# Patient Record
Sex: Male | Born: 1945 | Race: Black or African American | Hispanic: No | Marital: Married | Smoking: Former smoker
Health system: Southern US, Community
[De-identification: ages and names within clinical notes are randomized; demographics above are authoritative.]

## PROBLEM LIST (undated history)

## (undated) DIAGNOSIS — E785 Hyperlipidemia, unspecified: Secondary | ICD-10-CM

## (undated) DIAGNOSIS — K5790 Diverticulosis of intestine, part unspecified, without perforation or abscess without bleeding: Secondary | ICD-10-CM

## (undated) DIAGNOSIS — C61 Malignant neoplasm of prostate: Secondary | ICD-10-CM

## (undated) DIAGNOSIS — I10 Essential (primary) hypertension: Secondary | ICD-10-CM

## (undated) DIAGNOSIS — N529 Male erectile dysfunction, unspecified: Secondary | ICD-10-CM

## (undated) DIAGNOSIS — M1990 Unspecified osteoarthritis, unspecified site: Secondary | ICD-10-CM

## (undated) DIAGNOSIS — C801 Malignant (primary) neoplasm, unspecified: Secondary | ICD-10-CM

## (undated) HISTORY — DX: Male erectile dysfunction, unspecified: N52.9

## (undated) HISTORY — DX: Essential (primary) hypertension: I10

## (undated) HISTORY — PX: PROSTATECTOMY: SHX69

## (undated) HISTORY — DX: Diverticulosis of intestine, part unspecified, without perforation or abscess without bleeding: K57.90

## (undated) HISTORY — DX: Malignant (primary) neoplasm, unspecified: C80.1

## (undated) HISTORY — PX: PROSTATE SURGERY: SHX751

## (undated) HISTORY — PX: TRABECULECTOMY: SHX107

## (undated) HISTORY — DX: Unspecified osteoarthritis, unspecified site: M19.90

## (undated) HISTORY — DX: Hyperlipidemia, unspecified: E78.5

---

## 2004-05-30 ENCOUNTER — Encounter: Admission: RE | Admit: 2004-05-30 | Discharge: 2004-05-30 | Payer: Self-pay | Admitting: Family Medicine

## 2005-03-12 ENCOUNTER — Encounter: Admission: RE | Admit: 2005-03-12 | Discharge: 2005-03-12 | Payer: Self-pay | Admitting: Urology

## 2005-04-16 ENCOUNTER — Encounter (INDEPENDENT_AMBULATORY_CARE_PROVIDER_SITE_OTHER): Payer: Self-pay | Admitting: Specialist

## 2005-04-16 ENCOUNTER — Inpatient Hospital Stay (HOSPITAL_COMMUNITY): Admission: RE | Admit: 2005-04-16 | Discharge: 2005-04-20 | Payer: Self-pay | Admitting: Urology

## 2005-05-04 ENCOUNTER — Inpatient Hospital Stay (HOSPITAL_COMMUNITY): Admission: AD | Admit: 2005-05-04 | Discharge: 2005-05-09 | Payer: Self-pay | Admitting: Urology

## 2005-05-05 ENCOUNTER — Encounter (INDEPENDENT_AMBULATORY_CARE_PROVIDER_SITE_OTHER): Payer: Self-pay | Admitting: Specialist

## 2005-06-15 ENCOUNTER — Encounter: Admission: RE | Admit: 2005-06-15 | Discharge: 2005-06-15 | Payer: Self-pay | Admitting: Family Medicine

## 2006-01-19 ENCOUNTER — Ambulatory Visit: Payer: Self-pay | Admitting: Family Medicine

## 2006-02-22 ENCOUNTER — Ambulatory Visit: Payer: Self-pay | Admitting: Family Medicine

## 2006-03-25 ENCOUNTER — Ambulatory Visit: Payer: Self-pay | Admitting: Family Medicine

## 2006-05-18 HISTORY — PX: COLONOSCOPY: SHX174

## 2006-08-04 ENCOUNTER — Ambulatory Visit: Payer: Self-pay | Admitting: Family Medicine

## 2006-10-04 ENCOUNTER — Ambulatory Visit: Payer: Self-pay | Admitting: Family Medicine

## 2006-10-14 LAB — HM COLONOSCOPY

## 2007-01-24 ENCOUNTER — Ambulatory Visit: Payer: Self-pay | Admitting: Family Medicine

## 2007-01-25 ENCOUNTER — Ambulatory Visit: Admission: RE | Admit: 2007-01-25 | Discharge: 2007-04-17 | Payer: Self-pay | Admitting: Radiation Oncology

## 2007-04-25 ENCOUNTER — Ambulatory Visit: Payer: Self-pay | Admitting: Family Medicine

## 2007-09-08 ENCOUNTER — Ambulatory Visit: Payer: Self-pay | Admitting: Family Medicine

## 2007-09-08 ENCOUNTER — Encounter: Admission: RE | Admit: 2007-09-08 | Discharge: 2007-09-08 | Payer: Self-pay | Admitting: Family Medicine

## 2008-01-25 ENCOUNTER — Ambulatory Visit: Payer: Self-pay | Admitting: Family Medicine

## 2008-05-22 ENCOUNTER — Ambulatory Visit: Payer: Self-pay | Admitting: Family Medicine

## 2009-01-19 ENCOUNTER — Inpatient Hospital Stay (HOSPITAL_COMMUNITY): Admission: EM | Admit: 2009-01-19 | Discharge: 2009-01-20 | Payer: Self-pay | Admitting: Emergency Medicine

## 2009-01-19 ENCOUNTER — Ambulatory Visit: Payer: Self-pay | Admitting: Gastroenterology

## 2009-01-28 ENCOUNTER — Ambulatory Visit: Payer: Self-pay | Admitting: Family Medicine

## 2009-02-18 ENCOUNTER — Ambulatory Visit: Payer: Self-pay | Admitting: Family Medicine

## 2009-05-23 ENCOUNTER — Encounter: Admission: RE | Admit: 2009-05-23 | Discharge: 2009-05-23 | Payer: Self-pay | Admitting: Family Medicine

## 2009-05-23 ENCOUNTER — Ambulatory Visit: Payer: Self-pay | Admitting: Family Medicine

## 2010-01-08 ENCOUNTER — Ambulatory Visit (HOSPITAL_COMMUNITY): Admission: RE | Admit: 2010-01-08 | Discharge: 2010-01-08 | Payer: Self-pay | Admitting: Urology

## 2010-02-03 ENCOUNTER — Ambulatory Visit: Payer: Self-pay | Admitting: Family Medicine

## 2010-08-22 LAB — ABO/RH: ABO/RH(D): B POS

## 2010-08-22 LAB — RENAL FUNCTION PANEL
Albumin: 3 g/dL — ABNORMAL LOW (ref 3.5–5.2)
Albumin: 3.2 g/dL — ABNORMAL LOW (ref 3.5–5.2)
Albumin: 3.2 g/dL — ABNORMAL LOW (ref 3.5–5.2)
BUN: 11 mg/dL (ref 6–23)
BUN: 15 mg/dL (ref 6–23)
BUN: 8 mg/dL (ref 6–23)
CO2: 28 mEq/L (ref 19–32)
CO2: 28 mEq/L (ref 19–32)
CO2: 30 mEq/L (ref 19–32)
Calcium: 8.5 mg/dL (ref 8.4–10.5)
Calcium: 8.6 mg/dL (ref 8.4–10.5)
Calcium: 8.7 mg/dL (ref 8.4–10.5)
Chloride: 105 mEq/L (ref 96–112)
Chloride: 107 mEq/L (ref 96–112)
Chloride: 108 mEq/L (ref 96–112)
Creatinine, Ser: 1.15 mg/dL (ref 0.4–1.5)
Creatinine, Ser: 1.19 mg/dL (ref 0.4–1.5)
Creatinine, Ser: 1.3 mg/dL (ref 0.4–1.5)
GFR calc Af Amer: >60 mL/min (ref >60)
GFR calc Af Amer: >60 mL/min (ref >60)
GFR calc Af Amer: >60 mL/min (ref >60)
GFR calc non Af Amer: 56 mL/min — ABNORMAL LOW (ref >60)
GFR calc non Af Amer: >60 mL/min (ref >60)
GFR calc non Af Amer: >60 mL/min (ref >60)
Glucose, Bld: 113 mg/dL — ABNORMAL HIGH (ref 70–99)
Glucose, Bld: 122 mg/dL — ABNORMAL HIGH (ref 70–99)
Glucose, Bld: 141 mg/dL — ABNORMAL HIGH (ref 70–99)
Phosphorus: 2.6 mg/dL (ref 2.3–4.6)
Phosphorus: 3.1 mg/dL (ref 2.3–4.6)
Phosphorus: 3.6 mg/dL (ref 2.3–4.6)
Potassium: 4.4 mEq/L (ref 3.5–5.1)
Potassium: 5 mEq/L (ref 3.5–5.1)
Potassium: 5.1 mEq/L (ref 3.5–5.1)
Sodium: 139 mEq/L (ref 135–145)
Sodium: 140 mEq/L (ref 135–145)
Sodium: 140 mEq/L (ref 135–145)

## 2010-08-22 LAB — DIFFERENTIAL
Basophils Absolute: 0 10*3/uL (ref 0.0–0.1)
Basophils Relative: 1 % (ref 0–1)
Eosinophils Absolute: 0.2 10*3/uL (ref 0.0–0.7)
Eosinophils Relative: 3 % (ref 0–5)
Lymphocytes Relative: 26 % (ref 12–46)
Lymphs Abs: 1.9 10*3/uL (ref 0.7–4.0)
Monocytes Absolute: 0.7 10*3/uL (ref 0.1–1.0)
Monocytes Relative: 10 % (ref 3–12)
Neutro Abs: 4.3 10*3/uL (ref 1.7–7.7)
Neutrophils Relative %: 60 % (ref 43–77)

## 2010-08-22 LAB — CBC
HCT: 33.2 % — ABNORMAL LOW (ref 39.0–52.0)
HCT: 34.9 % — ABNORMAL LOW (ref 39.0–52.0)
HCT: 36.1 % — ABNORMAL LOW (ref 39.0–52.0)
HCT: 39.8 % (ref 39.0–52.0)
Hemoglobin: 11 g/dL — ABNORMAL LOW (ref 13.0–17.0)
Hemoglobin: 11.6 g/dL — ABNORMAL LOW (ref 13.0–17.0)
Hemoglobin: 11.9 g/dL — ABNORMAL LOW (ref 13.0–17.0)
Hemoglobin: 13.2 g/dL (ref 13.0–17.0)
MCHC: 33 g/dL (ref 30.0–36.0)
MCHC: 33.2 g/dL (ref 30.0–36.0)
MCHC: 33.2 g/dL (ref 30.0–36.0)
MCHC: 33.2 g/dL (ref 30.0–36.0)
MCV: 86.1 fL (ref 78.0–100.0)
MCV: 86.5 fL (ref 78.0–100.0)
MCV: 86.6 fL (ref 78.0–100.0)
MCV: 86.9 fL (ref 78.0–100.0)
Platelets: 149 10*3/uL — ABNORMAL LOW (ref 150–400)
Platelets: 167 10*3/uL (ref 150–400)
Platelets: 171 10*3/uL (ref 150–400)
Platelets: 176 10*3/uL (ref 150–400)
RBC: 3.84 MIL/uL — ABNORMAL LOW (ref 4.22–5.81)
RBC: 4.03 MIL/uL — ABNORMAL LOW (ref 4.22–5.81)
RBC: 4.16 MIL/uL — ABNORMAL LOW (ref 4.22–5.81)
RBC: 4.62 MIL/uL (ref 4.22–5.81)
RDW: 15.4 % (ref 11.5–15.5)
RDW: 15.7 % — ABNORMAL HIGH (ref 11.5–15.5)
RDW: 15.9 % — ABNORMAL HIGH (ref 11.5–15.5)
RDW: 15.9 % — ABNORMAL HIGH (ref 11.5–15.5)
WBC: 5.1 10*3/uL (ref 4.0–10.5)
WBC: 6.4 10*3/uL (ref 4.0–10.5)
WBC: 6.5 10*3/uL (ref 4.0–10.5)
WBC: 7.1 10*3/uL (ref 4.0–10.5)

## 2010-08-22 LAB — TYPE AND SCREEN
ABO/RH(D): B POS
Antibody Screen: NEGATIVE

## 2010-08-22 LAB — COMPREHENSIVE METABOLIC PANEL
ALT: 25 U/L (ref 0–53)
AST: 30 U/L (ref 0–37)
Albumin: 3.6 g/dL (ref 3.5–5.2)
Alkaline Phosphatase: 84 U/L (ref 39–117)
BUN: 19 mg/dL (ref 6–23)
CO2: 24 mEq/L (ref 19–32)
Calcium: 8.9 mg/dL (ref 8.4–10.5)
Chloride: 108 mEq/L (ref 96–112)
Creatinine, Ser: 1.38 mg/dL (ref 0.4–1.5)
GFR calc Af Amer: >60 mL/min (ref >60)
GFR calc non Af Amer: 52 mL/min — ABNORMAL LOW (ref >60)
Glucose, Bld: 154 mg/dL — ABNORMAL HIGH (ref 70–99)
Potassium: 4.5 mEq/L (ref 3.5–5.1)
Sodium: 140 mEq/L (ref 135–145)
Total Bilirubin: 0.8 mg/dL (ref 0.3–1.2)
Total Protein: 6.9 g/dL (ref 6.0–8.3)

## 2010-08-22 LAB — HEMOGLOBIN AND HEMATOCRIT, BLOOD
HCT: 34.9 % — ABNORMAL LOW (ref 39.0–52.0)
Hemoglobin: 11.6 g/dL — ABNORMAL LOW (ref 13.0–17.0)

## 2010-08-22 LAB — PROTIME-INR
INR: 1 (ref 0.00–1.49)
Prothrombin Time: 13.3 seconds (ref 11.6–15.2)

## 2010-08-22 LAB — APTT: aPTT: 26 seconds (ref 24–37)

## 2010-10-03 NOTE — Consult Note (Signed)
[name], [name]           ACCOUNT NO.:  [id number]   MEDICAL RECORD NO.:  [id number]          PATIENT TYPE:  INP   LOCATION:  1505                         FACILITY:  [facility]   PHYSICIAN:  Hollice Espy, M.D.DATE OF BIRTH:  Feb 15, 1946   DATE OF CONSULTATION:  05/07/2005  DATE OF DISCHARGE:                                   CONSULTATION   REASON FOR CONSULTATION:  Management of a DVT.   HISTORY OF PRESENT ILLNESS:  The patient is a 65 year old African-American  male with past medical history of prostate cancer and hypertension who was  admitted on May 04, 2005, after undergoing a radical prostatectomy on  April 16, 2005.  He was found to have a lower extremity occlusive DVT as  well as a pelvic lymphocele.  The patient was admitted, started on heparin,  and remained stable.  He showed no evidence of any pulmonary embolus.  The  patient underwent a left pelvic fluid collection drainage on May 05, 2005, when his lymphocele was drained. Following this on December 20, he was  restarted back on heparin, and Coumadin protocol was done for long-term  anticoagulation.  The patient remained stable.  [facility] were  consulted on December 21 to help with this patient in transitioning from  heparin to Lovenox and Coumadin to get patient home.   The patient himself is doing well.  He has no complaints.  He denies any  headaches, vision changes, dysphagia, chest pain, palpitations, shortness of  breath, wheeze, cough, abdominal pain.  His catheter has been removed, and  he is having some difficulty voiding, and he says that overall he feels  better.  He says the swelling on the left side of his groin has decreased  significantly.   REVIEW OF SYSTEMS:  Otherwise negative.   PAST MEDICAL HISTORY:  1.  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  2.  He is also status post a recent  drainage of a pelvic lymphocele.   MEDICATIONS:  The patient currently here in the hospital is on:  1.  IV heparin.  2.  Coumadin which has been initiated.  3.  Hydrochlorothiazide 12.5 p.o. daily.  4.  Colace 100 mg p.o. twice daily.  5.  Restoril p.r.n.  6.  Vicodin p.r.n.  7.  Percocet p.r.n.  8.  Dulcolax p.r.n.   ALLERGIES:  No known drug allergies.   SOCIAL HISTORY:  He denies any heavy tobacco, alcohol, or drug use.   FAMILY HISTORY:  Noncontributory.   PHYSICAL EXAMINATION:  VITAL SIGNS:  Heart rate 83, blood pressure 132/81,  respirations 20, temperature 96.5, O2 saturation 96% on room air.  GENERAL:  The patient is alert and oriented x3, no apparent distress.  HEENT:  Normocephalic and atraumatic.  Mucous membranes ae moist.  NECK:  He has no carotid bruits.  HEART: Regular rate and rhythm, S1, S2.  LUNGS:  Clear to auscultation bilaterally.  ABDOMEN: Soft, nontender, nondistended.  Positive bowel sounds.  EXTREMITIES:  His left groin is slightly indurated and slightly swollen,  although  he assures me that this much less so than before.  He has trace  pitting edema.  He is currently wearing some mild compressive hose.   LABORATORY WORK:  PT today is 15.5, INR 1.2.  White count 5.5, hemoglobin  10.7, hematocrit 32.7, platelet count 398.   ASSESSMENT AND PLAN:  1.  Deep vein thrombosis: The patient is currently on IV heparin, receiving      Coumadin therapy.  Will change heparin over the subcutaneous Lovenox 120      q.12h.  Pharmacy calculated this dose based on his weight at 1 mg/kg.      Pharmacy to confirm this dose prior to initiating.  I have also asked      case manager and social for assistance to see if we can set up patient      for home Lovenox therapy.  If this is so, the patient would be greatly      interested in this.  I discussed this with him.  In the meantime, we      will start the patient on the instruction book for Coumadin as well as       watching the cassette instructional video.  I have advised the patient      that being on Coumadin, his blood will be extremely thin and, while he      may resume a normal lifestyle, I would not recommend him going to work,      especially when we discussed that he is in [employer] and is often      involved in frequent very minor accidents including metal shavings,      cuts, and hand injuries.  I discussed this extensively with the patient      in the room as well as his wife, and we have agreed the best thing for      him would likely be to stay on disability or to find a possible side job      that does not involve active labor where he is at risk for injury.  If      patient is able to be approved for home Lovenox therapy, we will set up      home health and send him home on Lovenox and Coumadin with daily PT/INR      checks.  He has a primary care physician who can follow his INRs once he      has been discharged from the hospital.  If the patient is unable to get      Lovenox at home, then would continue with Coumadin plus Lovenox therapy      here in the hospital.  2.  Complaints of constipation which is patient's sole complaint.  He has      already been on Colace plus Dulcolax which do not seem to be helping.      We will try Senokot.  If this is not working, we can try something      stronger such as Fleet's enema.  The constipation is secondary to      patient's receiving narcotics.      Hollice Espy, M.D.  Electronically Signed     SKK/MEDQ  D:  05/08/2005  T:  05/09/2005  Job:  [id number]   cc:   [name], M.D.  Fax: [phone]

## 2010-10-03 NOTE — Discharge Summary (Signed)
[name], [name]           ACCOUNT NO.:  [id number]   MEDICAL RECORD NO.:  [id number]          PATIENT TYPE:  INP   LOCATION:  1505                         FACILITY:  [facility]   PHYSICIAN:  [name], M.D.    DATE OF BIRTH:  10/03/1945   DATE OF ADMISSION:  05/04/2005  DATE OF DISCHARGE:  05/09/2005                                 DISCHARGE SUMMARY   DISCHARGE DIAGNOSES:  1.  Prostate cancer.  2.  Left lower extremity deep venous thrombosis.  3.  Left pelvic lymphocele.   PROCEDURES:  1.  Percutaneous drainage of left pelvic lymphocele.  2.  Lower extremity duplex ultrasound.   SURGEON:  Dr. Excell Seltzer. Wrenn   CONSULTATIONS:  1.  Dr. Virginia Rochester.  2.  Dr. Susann Givens, primary care physician.   HISTORY OF PRESENT ILLNESS:  This is a very pleasant 65 year old gentleman  who had previously undergone radical retropubic prostatectomy on April 16, 2005.  This was accompanied with bilateral pelvic lymph node dissection.  He returned to clinic on May 04, 2005 for removal of his Foley catheter  as well as for wound check.  He was noted to have very edematous and tight  left lower extremity.  He was imaged with lower extremity venous duplex  ultrasound which revealed a lower extremity deep venous thrombosis as well  as a lymphocele.  The patient was admitted to the hospital for treatment of  the above complaints.   HOSPITAL COURSE:  On May 04, 2005, the patient was admitted to the  urology floor and was placed on heparin drip. On the first hospital day, he  was taken to interventional radiology who percutaneously drained his left  pelvic lymphocele by 75 mL of fluid.  This was sent for culture which was  ultimately negative.  It was also sent for creatinine and the fluid was  noted to be serous with a level of 1.3. He was then transferred back to the  floor in stable condition where he remained throughout his hospital stay.  Immediately after the aspiration and  lymphocele, he had immediate decrease  in edema and pain in his left lower extremity.  He was managed throughout  his hospitalization with heparin drip.  Once he was therapeutic on this  drip, he was bridged to Coumadin per the pharmacy team with a goal of nine  hour two to three.  Patient's INR did not bump so he was switched to Lovenox  120 mg subcu q.12h.  At this time, his INR was 1.2 and after consulting with  home care management and pharmacy it was decided to send the patient home  with a daily dose of 7.5 mg of Coumadin as well as with b.i.d. Lovenox x  seven days.   PHYSICAL EXAMINATION:  VITAL SIGNS: Vital signs per spreadsheet.  ABDOMEN: Soft, nondistended, nontender, palpation without costovertebral  angle tenderness.  Incision was clean, dry and intact, percutaneous drainage  site was clean, dry and intact.  EXTREMITIES: There is mild edema of his left lower extremity to the level of  the hip.  However, it is much decreased  since admission.  For remainder of  the physical exam, please consult history and physical as there is no  change.   DISCHARGE INSTRUCTIONS:  The patient was given instructions on wound care.  His wound care instructions from his previous hospitalization were  reinforced.  He was set up for home health care for administration of his  Lovenox 120 mg b.i.d. x 14 doses.   FOLLOW-UP PLAN:  He will follow up with Dr. Rito Ehrlich with his INR on  May 12, 2005.  He will then subsequently follow up with his primary  care physician, Dr. Sharlot Gowda.  He will also follow up with Dr. Annabell Howells for  wound check and check of his PSA as previously scheduled.     ______________________________  Glade Nurse, MD      [name], M.D.  Electronically Signed    MT/MEDQ  D:  06/01/2005  T:  06/02/2005  Job:  [id number]

## 2010-10-03 NOTE — Op Note (Signed)
[name], [name]           ACCOUNT NO.:  [id number]   MEDICAL RECORD NO.:  [id number]          PATIENT TYPE:  INP   LOCATION:  0002                         FACILITY:  [facility]   PHYSICIAN:  [name], M.D.    DATE OF BIRTH:  13-Dec-1945   DATE OF PROCEDURE:  04/16/2005  DATE OF DISCHARGE:                                 OPERATIVE REPORT   PREOPERATIVE DIAGNOSIS:  Prostatic adenocarcinoma.   POSTOPERATIVE DIAGNOSIS:  Prostatic adenocarcinoma.   PROCEDURE:  1.  Radical retropubic prostatectomy.  2.  Bilateral pelvic lymph node dissection.   SURGEON:  [name], M.D.   ASSISTANT:  Glade Nurse, M.D.   ANESTHESIA:  General endotracheal.   SPECIMENS:  1.  Prostate with seminal vesicles.  2.  Bilateral pelvic lymph node packets.   DESCRIPTION OF PROCEDURE:  The patient was identified by his wrist bracelet  and brought to room 10 where he received preprocedure antibiotics and was  administered general anesthesia. He was prepped and draped in the usual  sterile fashion taking care to minimize peripheral neuropathy or compartment  syndrome. Next, we placed a 20-French Foley catheter in his anterior urethra  and excellent clear urine output was obtained. Next, we made a 10 cm low  midline incision from his pubic symphysis to below his umbilicus. We carried  it down to the level of the fascia with Bovie cautery. The fascia was then  opened with Bovie cautery along its length. The coapting medial aspect of  the rectal bodies were identified and split bluntly and we then entered the  space of Retzius which was developed over the bladder and over the psoas  muscles bilaterally. Next, we set our retractors to provide exposure to the  right obturator lymph node basin. We split this tissue over the iliac vein  and using blunt dissection dissected back to the left pelvic sidewall. We  then identified the obturator nerve which was kept in plain view throughout  the dissection.  The pelvic lymph node tissue with the boundaries of the  iliac vein, the bifurcation of the iliac, Cooper's ligament and the pelvic  sidewall were then removed ligated and divided with Weck clips and sharp  dissection. We were careful not to involve the obturator nerve in the  dissection. This process was then repeated on the contralateral side. Both  specimen packets were sent separately for pathologic analysis. Next, we  reset the retraction to expose the endopelvic fascia on the patient's right  which was split with Bovie cautery in the groove between the prostate and  the levator fibers from the apex to the base with Bovie cautery. The  attaching levator fibers of the prostate were taken down with Bovie cautery.  This process was then repeated on the patient's left. We then used a large  Allis to coapt the cut edges of the endopelvic fascia and a backbleeding  stitch was placed with a figure-of-eight 2-0 Vicryl at the bladder neck.  Following this, using a sponge on a stick and a Kitner, we bluntly carefully  dissected off the fat overlying the dorsal venous complex. We  identified and  divided the puboprostatic ligaments sharply. Next we created a groove  between the patient's dorsal venous complex and the urethra. With the  surgeon's first finger, we then passed a Hoenfelter in the space and brought  a 2-0 Vicryl through on a pass which was secured. Following this, we divided  the dorsal venous complex proximal to the ligation stitch. Excellent  hemostasis was noted. We then placed a right angle between the urethra and  the rectum at the level of the apex of the prostate dividing the anterior  270 degrees of the urethra leaving adequate stump while not leaving residual  prostate cancer or prostate tissue. Next, the Foley catheter was grasped.  The valve stem was divided, and the Foley was brought onto the field. Next,  we divided the remaining 90 degrees of urethra posteriorly.  Following this,  we were able to bluntly develop the plane between the posterior aspect of  the prostate and Denonvilliers' fascia with the surgeon's first finger to  the level of the seminal vesicles. Because of the patient's PSA and prostate  biopsies, we elected not to nerve spare on either side. We began on the  patient's right and ligated and divided the prostatic pedicles with right  angle dissection and Weck clips to the level of the prostatic base. It was  then repeated on the left. We then sharply opened the posterior fascia  exposing the bilateral ampulla. Using sharp dissection, we dissected out  each vas deferens which was ligated and divided with Weck clip sharply. We  then used this as the medial plane to define the seminal vesicles which were  dissected out. Their tips were ligated and divided with Weck clips.  Following this, we addressed the anterior surface of the prostate. We placed  an Allis just superior and just inferior to the bladder neck. We dissected  down to the bladder neck with Bovie cautery and Vanderbilt dissection. The  urethra was opened, Foley catheter was deflated and used as a retractor  on  the prostate. The remaining posterior half of the bladder was separated from  the prostate using Bovie cautery. This left several posterior pedicles which  were ligated and divided with Weck clips. Following this, we inspected the  pelvis. There were two small venous oozing sites which were ligated with  interrupted 3-0 chromic sutures. Following this, we turned our attention to  the bladder neck. Anesthesia gave the patient an amp of indigo carmine, and  we were able to visualize bilateral ureteral orifices. We then repaired the  bladder neck with a tennis racquet closure using a running 2-0 Vicryl at the  6 o'clock position after bringing the bladder neck out to the sides of the surgeon's first finger. Next, we everted the bladder mucosa with interrupted  4-0  chromic sutures circumferentially. Next, we turned our attention to the  urethral stump. We placed a Greenwald grooved sound and placed interrupted 2-  0 Vicryls at the 12, 2, 4, 8 and 10 o'clock position in an outside to in  fashion. We then completed the second half of the anastomosis by placing  these sutures in the corresponding places in the bladder neck. Next, we  placed a 20 French Foley catheter through the urethra through the bladder  neck. 15 mL were instilled in the valve stem. Next, the retractor system was  taken down and we were able to slide down the bladder to complete the  vesicourethral anastomosis by securing the sutures  posteriorly on each side  and then anteriorly. Next, we flushed the Foley catheter. Excellent clear  blue urine output was obtained and the anastomosis was noted to be  watertight. Next, we made a 0.25 cm incision in the left lower quadrant,  brought a tonsil through the fascia on a pass. I brought the tonsil through  the fascia and then brought a fully fluted Blake drain through on a pass.  It was trimmed to length and secured to the skin with a 4-0 silk suture. The  fascia was then closed with a running #1 PDS, skin was closed with staples.  A JP was attached to the bulb, Foley catheter was attached to the drainage  bag. The patient was reversed from his anesthesia which he tolerated without  complications. Please note Dr. Bjorn Pippin was present and participated in  all aspects of this case.     ______________________________  Glade Nurse, MD      [name], M.D.  Electronically Signed    MT/MEDQ  D:  04/16/2005  T:  04/17/2005  Job:  [id number]

## 2010-10-03 NOTE — Discharge Summary (Signed)
[name], [name]           ACCOUNT NO.:  [id number]   MEDICAL RECORD NO.:  [id number]          PATIENT TYPE:  INP   LOCATION:  1402                         FACILITY:  [facility]   PHYSICIAN:  [name], M.D.    DATE OF BIRTH:  1945-11-25   DATE OF ADMISSION:  04/16/2005  DATE OF DISCHARGE:  04/20/2005                                 DISCHARGE SUMMARY   DISCHARGE DIAGNOSIS:  Prostate cancer.   PROCEDURES:  1.  Radical retropubic prostatectomy with bilateral pelvic lymph node      dissection.   SURGEON:  Dr. Annabell Howells.   CONSULTATIONS:  None.   HISTORY OF PRESENT ILLNESS:  This is a 65 year old gentleman who was found  to have adenocarcinoma of the prostate.  After reviewing his options, he has  elected to proceed with open radical retropubic prostatectomy.   HOSPITAL COURSE:  The patient was taken to the operating room on 04/16/05  and underwent the above-named procedures which he tolerated without  complication.  He was then taken to the floor and remained in stable  condition where he remained throughout his hospitalization without  complication.  For a complete description of the procedure, please consult  the operative note dated 04/16/05.  The patient's hospital course was  uncomplicated consisting of progressive toleration of ambulation, diet, and  pain.  His bladder was managed with a Foley catheter, and his output through  a Jackson-Pratt drain was followed throughout his hospitalization.  This  gradually declined throughout his first two postoperative days.  On  postoperative day #3, it declined to less than 10 mL per shift, and then  drain was subsequently removed.  It was then determined the patient was in  stable condition to be discharged home.   PHYSICAL EXAMINATION ON DISCHARGE:  Vital signs:  Temperature afebrile.  Vital signs are stable.  Abdomen:  Positive bowel sounds, soft,  nondistended, and nontender to palpation without costovertebral angle  tenderness.  The incision is clean, dry, and intact.  Staples are in place.  There is no surrounding erythema or exudate.  Jackson-Pratt drain site is  clean, dry, and intact without any erythema or exudate.  Foley catheter is  in place and draining clear urine.  Extremities are without edema, cyanosis,  or clubbing and nontender to palpation.  For the remainder of the physical  exam, please consult the admission H&P as it is unchanged.   DISCHARGE MEDICATIONS:  1.  Vicodin.  2. Colace.  3.  Ciprofloxacin to begin one day prior to      followup with Dr. Annabell Howells.   DISCHARGE INSTRUCTIONS:  The patient has been instructed to return if he has  any problems with his Foley catheter.  He is given leg bag and overnight bag  instructions.  He is given routine care instructions and instructed to  call or return if he has any redness or drainage from his wound.  He will  call with any nausea, vomiting, fevers, or chills.  He is instructed not to  drive for the next two weeks and not to lift for the next six weeks.  He  is  instructed not to soak the wound until postoperative day #10.  He  understands and agrees with these instructions.     ______________________________  Glade Nurse, MD      [name], M.D.  Electronically Signed    MT/MEDQ  D:  06/08/2005  T:  06/09/2005  Job:  [id number]

## 2010-10-03 NOTE — Op Note (Signed)
[name], [name]           ACCOUNT NO.:  [id number]   MEDICAL RECORD NO.:  [id number]          PATIENT TYPE:  INP   LOCATION:  1505                         FACILITY:  [facility]   PHYSICIAN:  [name], M.D.    DATE OF BIRTH:  1945/12/26   DATE OF PROCEDURE:  DATE OF DISCHARGE:                                 OPERATIVE REPORT   CHIEF COMPLAINT:  1.  Left lower extremity deep venous thrombosis.  2.  Left pelvic lymphocele.  3.  Status post retropubic prostatectomy.   HISTORY OF PRESENT ILLNESS:  This is a 65 year old gentleman who underwent  radical retropubic prostatectomy with bilateral pelvic lymph node dissection  on April 16, 2005.  His hospital course was relatively uncomplicated, and  the patient was discharged home on postop day #3.  On final pathology, he  was found to have pathologic stage T3B N0 MX with negative margins.  He  returned today for removal of Foley catheter and on exam of the patient, he  was noted to have a swollen left lower extremity which the patient states  has been progressing over the last 4-5 days.  He denied any other  complaints.  He denied any nausea, vomiting, fever, or chills.  He states he  is tolerating diet and denies any pain, denies any shortness of breath.   PAST MEDICAL HISTORY:  1.  Prostate cancer.  2.  Hypertension.   MEDICATIONS:  unknown blood pressure pill   ALLERGIES:  NKDA.   PAST SURGICAL HISTORY:  Above.   FAMILY HISTORY:  Negative for any history of GU cancer or stones.   SOCIAL HISTORY:  Noncontributory.   REVIEW OF SYSTEMS:  Multisystem review of systems was conducted and was  positive only for stress urinary incontinence status post Foley catheter  removal today.   PHYSICAL EXAMINATION:  VITAL SIGNS:  Afebrile, pulse 85, respirations 12,  blood pressure has not been recorded.  GENERAL:  This is a 65 year old gentleman in no acute distress.  HEENT:  Atraumatically, normocephalic.  Pupils equal, round,  and reactive to  light and accommodation.  Extraocular muscles are intact.  Mucous membranes  are moist and are intact.  NECK:  Supple.  No cervical adenopathy and no JVD.  CHEST:  Regular rate and rhythm with no murmurs, rubs, or gallops.  Lungs  clear to auscultation.  ABDOMEN:  Positive bowel sounds, soft, nondistended, nontender on palpation.  Lower midline incision is clean, dry, and intact without surrounding  erythema or exudate.  PHALLUS:  Uncircumcised penis without lesion; there are no penile plaques.  Testicles are descended bilaterally and are of normal contour.  Cord  structures are normal in contour bilaterally.  Scrotal skin is without  lesion.  LOWER EXTREMITIES:  Left lower extremity between hip and knee is markedly  swollen versus the contralateral side.  On palpation, it is very firm in  nature.  There is no erythema to the skin, rash, or warmth to the skin.  Lower extremity is without edema or tenderness to palpation.   IMAGING:  CT cystogram in office today shows large left pelvic lymph  lymphocele.  Vascular lower extremity duplex ultrasound from [facility]  demonstrates obstructive left lower extremity DVT.   ASSESSMENT/PLAN:  This is a 65 year old gentleman status post radical  retropubic prostatectomy with bilateral lymph node dissection with left  lower extremity deep venous thrombosis and the presence of a left pelvic  lymphocele.   PLAN:  1.  We will admit the patient to the hospital to a telemetry bed.  We will      check CBC, BNP, and coags on admission.  We will then promptly begin      heparin drip which will be dosed by the pharmacy with a goal of TTT 60-      80 without boluses.  We will also contact interventional radiology to      have a CT guided drain placed in his pelvic lymphocele.  This certainly      may be contributing to the outflow obstruction in his left lower      extremity.  We will stop heparin drip 4 hours prior to this procedure       and begin at  1 hour after the procedure.  The patient will then be      slowly bridged to Coumadin therapy.  When this process begins, we will      obtain a hospital consult to assist in Coumadin dosing as well as to      assist in the patient's outpatient dosing regimen.  2.  We will continue the patient's Cipro, status post indwelling Foley      catheter removal.     ______________________________  Glade Nurse, MD      [name], M.D.  Electronically Signed    MT/MEDQ  D:  05/04/2005  T:  05/05/2005  Job:  [id number]

## 2010-12-12 ENCOUNTER — Encounter: Payer: Self-pay | Admitting: Family Medicine

## 2011-02-04 ENCOUNTER — Ambulatory Visit (INDEPENDENT_AMBULATORY_CARE_PROVIDER_SITE_OTHER): Payer: BC Managed Care – PPO | Admitting: Family Medicine

## 2011-02-04 ENCOUNTER — Encounter: Payer: Self-pay | Admitting: Family Medicine

## 2011-02-04 VITALS — BP 116/80 | HR 72 | Ht 77.0 in | Wt 261.0 lb

## 2011-02-04 DIAGNOSIS — E1139 Type 2 diabetes mellitus with other diabetic ophthalmic complication: Secondary | ICD-10-CM | POA: Insufficient documentation

## 2011-02-04 DIAGNOSIS — M129 Arthropathy, unspecified: Secondary | ICD-10-CM

## 2011-02-04 DIAGNOSIS — Z23 Encounter for immunization: Secondary | ICD-10-CM

## 2011-02-04 DIAGNOSIS — I10 Essential (primary) hypertension: Secondary | ICD-10-CM

## 2011-02-04 DIAGNOSIS — E118 Type 2 diabetes mellitus with unspecified complications: Secondary | ICD-10-CM | POA: Insufficient documentation

## 2011-02-04 DIAGNOSIS — E1169 Type 2 diabetes mellitus with other specified complication: Secondary | ICD-10-CM | POA: Insufficient documentation

## 2011-02-04 DIAGNOSIS — E785 Hyperlipidemia, unspecified: Secondary | ICD-10-CM | POA: Insufficient documentation

## 2011-02-04 DIAGNOSIS — Z0000 Encounter for general adult medical examination without abnormal findings: Secondary | ICD-10-CM

## 2011-02-04 DIAGNOSIS — E1159 Type 2 diabetes mellitus with other circulatory complications: Secondary | ICD-10-CM

## 2011-02-04 DIAGNOSIS — H409 Unspecified glaucoma: Secondary | ICD-10-CM | POA: Insufficient documentation

## 2011-02-04 DIAGNOSIS — M1990 Unspecified osteoarthritis, unspecified site: Secondary | ICD-10-CM

## 2011-02-04 DIAGNOSIS — E119 Type 2 diabetes mellitus without complications: Secondary | ICD-10-CM

## 2011-02-04 DIAGNOSIS — I499 Cardiac arrhythmia, unspecified: Secondary | ICD-10-CM

## 2011-02-04 DIAGNOSIS — Z8546 Personal history of malignant neoplasm of prostate: Secondary | ICD-10-CM

## 2011-02-04 LAB — POCT URINALYSIS DIPSTICK
Bilirubin, UA: NEGATIVE
Blood, UA: NEGATIVE
Nitrite, UA: NEGATIVE
pH, UA: 5

## 2011-02-04 LAB — CBC WITH DIFFERENTIAL/PLATELET
Eosinophils Absolute: 0.2 10*3/uL (ref 0.0–0.7)
Eosinophils Relative: 4 % (ref 0–5)
Hemoglobin: 14.4 g/dL (ref 13.0–17.0)
Lymphs Abs: 2.1 10*3/uL (ref 0.7–4.0)
MCH: 27.4 pg (ref 26.0–34.0)
MCV: 84.2 fL (ref 78.0–100.0)
Monocytes Relative: 13 % — ABNORMAL HIGH (ref 3–12)
RBC: 5.26 MIL/uL (ref 4.22–5.81)

## 2011-02-04 LAB — LIPID PANEL
Total CHOL/HDL Ratio: 3.1 Ratio
VLDL: 9 mg/dL (ref 0–40)

## 2011-02-04 LAB — COMPREHENSIVE METABOLIC PANEL
AST: 25 U/L (ref 0–37)
Albumin: 3.9 g/dL (ref 3.5–5.2)
Alkaline Phosphatase: 74 U/L (ref 39–117)
Potassium: 4.7 mEq/L (ref 3.5–5.3)
Sodium: 139 mEq/L (ref 135–145)
Total Protein: 6.7 g/dL (ref 6.0–8.3)

## 2011-02-04 MED ORDER — PRAVASTATIN SODIUM 40 MG PO TABS: 40.0000 mg | ORAL_TABLET | Freq: Every evening | ORAL | Status: DC

## 2011-02-04 MED ORDER — LISINOPRIL-HYDROCHLOROTHIAZIDE 10-12.5 MG PO TABS: 1.0000 | ORAL_TABLET | Freq: Every day | ORAL | Status: DC

## 2011-02-04 NOTE — Progress Notes (Signed)
  Subjective:    Patient ID: [name], male    DOB: [birth year], 65 y.o.   MRN: [id number]  HPI He is here for an interval evaluation. He continues to be followed at the [place]. He goes there several times a year. They do follow his diabetes, hypertension and arthritis. He was also recently seen by his urologist. That note was reviewed. He had a colonoscopy in 2010. He has not had a abdominal discomfort. Does have a previous history of diverticulosis. Past medical and social history were reviewed and are in the chart   Review of Systems Negative except as above    Objective:   Physical Exam BP 116/80  Pulse 72  Ht 6\' 5"  (1.956 m)  Wt 261 lb (118.389 kg)  BMI 30.95 kg/m2  General Appearance:    Alert, cooperative, no distress, appears stated age  Head:    Normocephalic, without obvious abnormality, atraumatic  Eyes:    PERRL, conjunctiva/corneas clear, EOM's intact, fundi    benign  Ears:    Normal TM's and external ear canals  Nose:   Nares normal, mucosa normal, no drainage or sinus   tenderness  Throat:   Lips, mucosa, and tongue normal; teeth and gums normal  Neck:   Supple, no lymphadenopathy;  thyroid:  no   enlargement/tenderness/nodules; no carotid   bruit or JVD  Back:    Spine nontender, no curvature, ROM normal, no CVA     tenderness  Lungs:     Clear to auscultation bilaterally without wheezes, rales or     ronchi; respirations unlabored  Chest Wall:    No tenderness or deformity   Heart:    a slightly irregular rhythm is noted, S1 and S2 normal, no murmur, rub   or gallop  Breast Exam:    No chest wall tenderness, masses or gynecomastia  Abdomen:     Soft, non-tender, nondistended, normoactive bowel sounds,    no masses, no hepatosplenomegaly        Extremities:   No clubbing, cyanosis or edema  Pulses:   2+ and symmetric all extremities  Skin:   Skin color, texture, turgor normal, no rashes or lesions  Lymph nodes:   Cervical, supraclavicular, and axillary nodes  normal  Neurologic:   CNII-XII intact, normal strength, sensation and gait; reflexes 2+ and symmetric throughout          Psych:   Normal mood, affect, hygiene and grooming.    EKG shows unifocal PVC       Assessment & Plan:   1. Physical exam, annual  POCT Urinalysis Dipstick, PSA, CBC w/Diff, Lipid Profile, Comprehensive metabolic panel, POCT Occult Blood Stool  2. Diabetes mellitus    3. Arrhythmia  PR ELECTROCARDIOGRAM, COMPLETE  4. Hypertension associated with diabetes    5. Hyperlipidemia LDL goal <70    6. History of prostate cancer    7. Glaucoma    8. Arthritis     Prescriptions written for Prinzide and Mevacor. He will have them filled at the [place]. Flu shot given. He was given a copy of the EKG to take with him to the [place].

## 2011-02-05 ENCOUNTER — Telehealth: Payer: Self-pay

## 2011-02-05 NOTE — Telephone Encounter (Signed)
Left message for pt to call me back and [facility]

## 2011-07-02 ENCOUNTER — Telehealth: Payer: Self-pay | Admitting: Family Medicine

## 2011-07-02 NOTE — Telephone Encounter (Signed)
DONE

## 2011-09-09 ENCOUNTER — Other Ambulatory Visit: Payer: Self-pay | Admitting: Urology

## 2011-09-09 DIAGNOSIS — C61 Malignant neoplasm of prostate: Secondary | ICD-10-CM

## 2011-09-14 ENCOUNTER — Other Ambulatory Visit: Payer: Self-pay | Admitting: Urology

## 2011-09-14 DIAGNOSIS — C61 Malignant neoplasm of prostate: Secondary | ICD-10-CM

## 2011-09-18 ENCOUNTER — Other Ambulatory Visit (HOSPITAL_COMMUNITY): Payer: BC Managed Care – PPO

## 2011-09-18 ENCOUNTER — Encounter (HOSPITAL_COMMUNITY): Payer: Self-pay

## 2011-09-18 ENCOUNTER — Ambulatory Visit (HOSPITAL_COMMUNITY): Payer: BC Managed Care – PPO

## 2011-09-18 ENCOUNTER — Encounter (HOSPITAL_COMMUNITY)
Admission: RE | Admit: 2011-09-18 | Discharge: 2011-09-18 | Disposition: A | Discharge status: Home / Self Care | Payer: Medicare Other | Source: Ambulatory Visit | Attending: Urology | Admitting: Urology

## 2011-09-18 DIAGNOSIS — C61 Malignant neoplasm of prostate: Secondary | ICD-10-CM | POA: Insufficient documentation

## 2011-09-18 MED ORDER — TECHNETIUM TC 99M MEDRONATE IV KIT
23.5000 | PACK | Freq: Once | INTRAVENOUS | Status: AC | PRN
  Administered 2011-09-18: 23.5 via INTRAVENOUS

## 2011-10-17 ENCOUNTER — Encounter: Payer: Self-pay | Admitting: Family Medicine

## 2011-10-17 ENCOUNTER — Ambulatory Visit (INDEPENDENT_AMBULATORY_CARE_PROVIDER_SITE_OTHER): Payer: Medicare Other | Admitting: Family Medicine

## 2011-10-17 VITALS — BP 134/94 | HR 90 | Temp 99.9°F | Resp 20 | Ht 75.0 in | Wt 261.0 lb

## 2011-10-17 DIAGNOSIS — J309 Allergic rhinitis, unspecified: Secondary | ICD-10-CM

## 2011-10-17 DIAGNOSIS — I10 Essential (primary) hypertension: Secondary | ICD-10-CM

## 2011-10-17 DIAGNOSIS — T783XXA Angioneurotic edema, initial encounter: Secondary | ICD-10-CM

## 2011-10-17 DIAGNOSIS — E119 Type 2 diabetes mellitus without complications: Secondary | ICD-10-CM

## 2011-10-17 LAB — GLUCOSE, POCT (MANUAL RESULT ENTRY): POC Glucose: 185 mg/dl — AB (ref 70–99)

## 2011-10-17 MED ORDER — EPINEPHRINE 0.3 MG/0.3ML IJ DEVI: 0.3000 mg | Freq: Once | INTRAMUSCULAR | Status: DC

## 2011-10-17 MED ORDER — HYDROCHLOROTHIAZIDE 25 MG PO TABS: 25.0000 mg | ORAL_TABLET | Freq: Every day | ORAL | Status: DC

## 2011-10-17 MED ORDER — FLUTICASONE PROPIONATE 50 MCG/ACT NA SUSP: 2.0000 | Freq: Every day | NASAL | Status: DC

## 2011-10-17 MED ORDER — METHYLPREDNISOLONE SODIUM SUCC 125 MG IJ SOLR
125.0000 mg | Freq: Once | INTRAMUSCULAR | Status: AC
  Administered 2011-10-17: 125 mg via INTRAMUSCULAR

## 2011-10-17 MED ORDER — DIPHENHYDRAMINE HCL 50 MG/ML IJ SOLN
50.0000 mg | Freq: Once | INTRAMUSCULAR | Status: AC
  Administered 2011-10-17: 50 mg via INTRAMUSCULAR

## 2011-10-17 MED ORDER — LISINOPRIL 20 MG PO TABS: 20.0000 mg | ORAL_TABLET | Freq: Every day | ORAL | Status: DC

## 2011-10-17 NOTE — Progress Notes (Signed)
Patient Name: [name] Date of Birth: Jul 24, 1945 Medical Record Number: [id number] Gender: male Date of Encounter: 10/17/2011  History of Present Illness:  [name] is a 66 y.o. very pleasant male patient who presents with the following:  Here today for a possible allergic reaction. He has apparently had angioedema with severe lip swelling in the past.  Yesterday his lower lip was swollen- this went down but this morning his upper lip swelled up.  He does not feel that he is having any difficulty with breathing, but he has noted some "phlegm dripping down his throat" for the last 10 days or so.  He is taking lisinopril and has been for a few years.     He last took lisinopril yesterday.   He has had angioedema 7 or 8 times within the last several years. However, it usually resolves without incident within a day or so.  His sister has a history of the same    Patient Active Problem List  Diagnoses  . Diabetes mellitus  . Hypertension associated with diabetes  . Hyperlipidemia LDL goal <70  . History of prostate cancer  . Glaucoma  . Arthritis   Past Medical History  Diagnosis Date  . Diverticulosis   . ED (erectile dysfunction)   . Diabetes mellitus   . Arthritis   . Cancer     PROSTATE  . Hypertension   . Hemorrhoids   . Dyslipidemia    Past Surgical History  Procedure Date  . Prostate surgery     PROSTATECTOMY   History  Substance Use Topics  . Smoking status: Former Games developer  . Smokeless tobacco: Never Used  . Alcohol Use: Not on file   Family History  Problem Relation Age of Onset  . Diabetes Mother   . Diabetes Sister   . Diabetes Brother   . Diabetes Maternal Aunt   . Diabetes Maternal Uncle   . Diabetes Maternal Grandmother   . Diabetes Maternal Grandfather    No Known Allergies  Medication list has been reviewed and updated.  Prior to Admission medications   Medication Sig Start Date End Date Taking? Authorizing Provider    calcium-vitamin D (OSCAL WITH D) 500-200 MG-UNIT per tablet Take 1 tablet by mouth daily.      Historical Provider, MD  lisinopril-hydrochlorothiazide (ZESTORETIC) 10-12.5 MG per tablet Take 1 tablet by mouth daily. 02/04/11 02/04/12  Ronnald Nian, MD  metFORMIN (GLUCOPHAGE) 500 MG tablet Take 500 mg by mouth 2 (two) times daily with a meal.      Historical Provider, MD  pravastatin (PRAVACHOL) 40 MG tablet Take 1 tablet (40 mg total) by mouth every evening. 02/04/11 02/04/12  Ronnald Nian, MD  psyllium (METAMUCIL) 58.6 % packet Take 1 packet by mouth daily.      Historical Provider, MD  zolpidem (AMBIEN CR) 6.25 MG CR tablet Take 10 mg by mouth at bedtime as needed.      Historical Provider, MD    Review of Systems:  As per HPI- otherwise negative.  Physical Examination: There were no vitals filed for this visit. There were no vitals filed for this visit. There is no height or weight on file to calculate BMI.  GEN: WDWN, NAD, Non-toxic, A & O x 3 HEENT: Atraumatic, Normocephalic. Neck supple. No masses, No LAD.  Large swelling of upper lip only- no lower lip swelling, palate or uvula edema.  No tongue swelling.  PEERL, EOMI Ears and Nose: No external deformity. CV: RRR,  No M/G/R. No JVD. No thrill. No extra heart sounds. PULM: CTA B, no wheezes, crackles, rhonchi. No retractions. No resp. distress. No accessory muscle use. ABD: S, NT, ND, +BS. No rebound. No HSM. EXTR: No c/c/e NEURO Normal gait.  PSYCH: Normally interactive. Conversant. Not depressed or anxious appearing.  Calm demeanor.   Results for orders placed in visit on 10/17/11  GLUCOSE, POCT (MANUAL RESULT ENTRY)      Component Value Range   POC Glucose 185 (*) 70 - 99 (mg/dl)  POCT GLYCOSYLATED HEMOGLOBIN (HGB A1C)      Component Value Range   Hemoglobin A1C 6.4     EKG: sinus rhythm with some PVCs on rhythm strip. No concerning ST elevation or depression Assessment and Plan: 1. Angioedema  methylPREDNISolone sodium  succinate (SOLU-MEDROL) 125 mg/2 mL injection 125 mg, diphenhydrAMINE (BENADRYL) injection 50 mg, EKG 12-Lead, EPINEPHrine (EPIPEN) 0.3 mg/0.3 mL DEVI, DISCONTINUED: EPINEPHrine (EPIPEN) 0.3 mg/0.3 mL DEVI  2. Diabetes mellitus type II  POCT glucose (manual entry), POCT glycosylated hemoglobin (Hb A1C), EKG 12-Lead  3. Allergic rhinitis  fluticasone (FLONASE) 50 MCG/ACT nasal spray, DISCONTINUED: fluticasone (FLONASE) 50 MCG/ACT nasal spray   Angioedema.  Treated right away with 50 mg benadryl IM, solu- medrol 125 mg IM, and zantac 150 mg.  He states that he has already taken a generic 2nd generation anti- histamine today.  Swelling only involves the upper lip.  He has no edema of the soft palate or tongue, no SOB, no wheezing or hives.  Offered to send him to the ED for observation.  However, we will be able to observe him here for about 4 hours before closing time, so they elected to stay here for observation.  His companion went to purchase his epipen so they would have it on hand.  Explained that solu- medrol may raise his glucose- he is ok with this and will watch his diet more closely.    Observed [name] at clinic for about 3.5 hours. His swelling decreased, and he never developed any SOB or swelling into his palate.  Offered to send them to the ER for further observation, but they prefer to proceed to home.  Went over epipen use and precautions that should prompt immediate ER evaluation or 911 call.   addnd 10/18/11: called to check on him.  He reports that his swelling is going down, and he is feeling well.  He is continuing to take benadryl.  Asked him to be sure to let me know if his swelling is not totally resolved in 1 or 2 more days.  Abbe Amsterdam, MD 10/17/2011 2:42 PM

## 2011-10-17 NOTE — Patient Instructions (Addendum)
You were seen today for angioedema- this is a condition that can cause swelling of the lips, mouth and face.  This is likely due to your lisinopril medication- you are on a combination blood pressure medication called lisinopril/ hctz  Continue to take benadryl- 25 or 50 mg every 4 to 6 hours until the swelling goes down.  Remember this can make you drowsy- too drowsy to drive!  You may also continue to take allegra daily, and you may want to take a zantac (stomach medication) daily for the next few days.    Keep your epipen nearby and use if you have difficulty breathing.  If you need to use your epipen please call 911.  If you develop swelling of the mouth or tongue please go to the ER.  Do not take lisinopril any more- this may be the cause of your symptoms.  I will call in plain HCTZ to your pharmacy to take instead.    Your blood sugar may go up some for the next few days due to the steroid shot that you received.

## 2012-01-29 ENCOUNTER — Encounter: Payer: Self-pay | Admitting: Internal Medicine

## 2012-02-04 ENCOUNTER — Encounter: Payer: Self-pay | Admitting: Family Medicine

## 2012-02-04 ENCOUNTER — Ambulatory Visit (INDEPENDENT_AMBULATORY_CARE_PROVIDER_SITE_OTHER): Payer: Medicare Other | Admitting: Family Medicine

## 2012-02-04 VITALS — BP 118/80 | HR 82 | Ht 77.0 in | Wt 266.0 lb

## 2012-02-04 DIAGNOSIS — H409 Unspecified glaucoma: Secondary | ICD-10-CM

## 2012-02-04 DIAGNOSIS — I10 Essential (primary) hypertension: Secondary | ICD-10-CM

## 2012-02-04 DIAGNOSIS — M1990 Unspecified osteoarthritis, unspecified site: Secondary | ICD-10-CM

## 2012-02-04 DIAGNOSIS — T464X5A Adverse effect of angiotensin-converting-enzyme inhibitors, initial encounter: Secondary | ICD-10-CM

## 2012-02-04 DIAGNOSIS — Z0000 Encounter for general adult medical examination without abnormal findings: Secondary | ICD-10-CM

## 2012-02-04 DIAGNOSIS — Z8546 Personal history of malignant neoplasm of prostate: Secondary | ICD-10-CM

## 2012-02-04 DIAGNOSIS — M129 Arthropathy, unspecified: Secondary | ICD-10-CM

## 2012-02-04 DIAGNOSIS — Z23 Encounter for immunization: Secondary | ICD-10-CM

## 2012-02-04 DIAGNOSIS — E785 Hyperlipidemia, unspecified: Secondary | ICD-10-CM

## 2012-02-04 DIAGNOSIS — E1169 Type 2 diabetes mellitus with other specified complication: Secondary | ICD-10-CM

## 2012-02-04 DIAGNOSIS — E119 Type 2 diabetes mellitus without complications: Secondary | ICD-10-CM

## 2012-02-04 LAB — LIPID PANEL: Cholesterol: 127 mg/dL (ref 0–200)

## 2012-02-04 LAB — COMPREHENSIVE METABOLIC PANEL
ALT: 20 U/L (ref 0–53)
AST: 27 U/L (ref 0–37)
Albumin: 4.1 g/dL (ref 3.5–5.2)
CO2: 26 mEq/L (ref 19–32)
Calcium: 9.5 mg/dL (ref 8.4–10.5)
Chloride: 105 mEq/L (ref 96–112)
Creat: 1.27 mg/dL (ref 0.50–1.35)
Sodium: 140 mEq/L (ref 135–145)
Total Protein: 7.1 g/dL (ref 6.0–8.3)

## 2012-02-04 LAB — CBC WITH DIFFERENTIAL/PLATELET
Basophils Relative: 1 % (ref 0–1)
Eosinophils Absolute: 0.1 10*3/uL (ref 0.0–0.7)
Hemoglobin: 15 g/dL (ref 13.0–17.0)
MCH: 27.3 pg (ref 26.0–34.0)
MCHC: 33.3 g/dL (ref 30.0–36.0)
Monocytes Absolute: 0.6 10*3/uL (ref 0.1–1.0)
Monocytes Relative: 11 % (ref 3–12)
Neutrophils Relative %: 46 % (ref 43–77)

## 2012-02-04 LAB — POCT URINALYSIS DIPSTICK
Ketones, UA: NEGATIVE
Leukocytes, UA: NEGATIVE
Protein, UA: NEGATIVE
Urobilinogen, UA: NEGATIVE
pH, UA: 7

## 2012-02-04 LAB — HEMOCCULT GUIAC POC 1CARD (OFFICE)

## 2012-02-04 MED ORDER — INFLUENZA VIRUS VACC SPLIT PF IM SUSP: 0.5000 mL | Freq: Once | INTRAMUSCULAR | Status: DC

## 2012-02-04 NOTE — Patient Instructions (Addendum)
[facility] and [facility] out [facility] you are now on a kidney protecting medication.

## 2012-02-04 NOTE — Progress Notes (Signed)
Subjective:    Patient ID: [name], male    DOB: 1945-10-05, 66 y.o.   MRN: [id number]  HPI He is here for an initial welcome tdo Medicare visit. His immunizations were reviewed and will be updated. Information on advanced directive was discussed. He does check his blood sugars usually once per week. He exercises regularly. Doesn't smoke or drink. He does have diabetes related shoes and does check his feet fairly regularly. Marriage is going well. He is followed at the [place] and usually sees me once per year. He does have underlying prostate cancer and did have a prostatectomy. His most recent PSA was in may and was right around 2. Does have glaucoma and is now on new drops. He did have difficulty with angioedema and his ACE inhibitor was stopped. It is unclear as to whether he is now on a different blood pressure medication. He did not bring his medications in. He does have a history of arthritis and this gives her very little difficulty.   Review of Systems  Constitutional: Negative.   HENT: Positive for facial swelling.   Eyes: Negative.   Respiratory: Negative.   Cardiovascular: Negative.   Gastrointestinal: Negative.   Genitourinary: Negative.   Musculoskeletal: Positive for arthralgias.  Neurological: Negative.   Hematological: Negative.        Objective:   Physical Exam BP 118/80  Pulse 82  Ht 6\' 5"  (1.956 m)  Wt 266 lb (120.657 kg)  BMI 31.54 kg/m2  SpO2 98%  General Appearance:    Alert, cooperative, no distress, appears stated age  Head:    Normocephalic, without obvious abnormality, atraumatic  Eyes:    PERRL, conjunctiva/corneas clear, EOM's intact,  Ears:    Normal TM's and external ear canals  Nose:   Nares normal, mucosa normal, no drainage or sinus   tenderness  Throat:   Lips, mucosa, and tongue normal; teeth and gums normal  Neck:   Supple, no lymphadenopathy;  thyroid:  no   enlargement/tenderness/nodules; no carotid   bruit or JVD  Back:    Spine  nontender, no curvature, ROM normal, no CVA     tenderness  Lungs:     Clear to auscultation bilaterally without wheezes, rales or     ronchi; respirations unlabored  Chest Wall:    No tenderness or deformity   Heart:    Regular rate and rhythm, S1 and S2 normal, no murmur, rub   or gallop  Breast Exam:    No chest wall tenderness, masses or gynecomastia  Abdomen:     Soft, non-tender, nondistended, normoactive bowel sounds,    no masses, no hepatosplenomegaly  Genitalia:    deferred   Rectal:    Normal sphincter tone, no masses or tenderness; guaiac negative stool.  Prostate not palpable   Extremities:   No clubbing, cyanosis or edema  Pulses:   2+ and symmetric all extremities  Skin:   Skin color, texture, turgor normal, no rashes or lesions  Lymph nodes:   Cervical, supraclavicular, and axillary nodes normal  Neurologic:   CNII-XII intact, normal strength, sensation and gait; reflexes 2+ and symmetric throughout          Psych:   Normal mood, affect, hygiene and grooming.           Assessment & Plan:   1. Routine general medical examination at a health care facility  POCT Urinalysis Dipstick, Flu vaccine greater than or equal to 3yo preservative free IM, Pneumococcal polysaccharide vaccine 23-valent  greater than or equal to 2yo subcutaneous/IM, CBC with Differential, Comprehensive metabolic panel, Lipid panel  2. Need for prophylactic vaccination and inoculation against influenza  influenza  inactive virus vaccine (FLUZONE/FLUARIX) injection 0.5 mL, Flu vaccine greater than or equal to 3yo preservative free IM, Pneumococcal polysaccharide vaccine 23-valent greater than or equal to 2yo subcutaneous/IM  3. History of prostate cancer  PSA  4. Hyperlipidemia LDL goal <70  Lipid panel  5. Hypertension associated with diabetes  CBC with Differential, Comprehensive metabolic panel  6. Diabetes mellitus  CBC with Differential, Comprehensive metabolic panel  7. Arthritis    8. ACE  inhibitor-aggravated angioedema    9. Glaucoma     he will continue to take good care of [facility] He also is to followup with his urologist on a regular basis. In general things are going fairly well. He has a good handle on handling the prostate cancer. In the light issues were also discussed with him.

## 2012-02-05 ENCOUNTER — Encounter: Payer: BC Managed Care – PPO | Admitting: Family Medicine

## 2012-05-27 ENCOUNTER — Other Ambulatory Visit: Payer: Self-pay | Admitting: Urology

## 2012-05-27 DIAGNOSIS — C61 Malignant neoplasm of prostate: Secondary | ICD-10-CM

## 2012-06-20 ENCOUNTER — Encounter (HOSPITAL_COMMUNITY)
Admission: RE | Admit: 2012-06-20 | Discharge: 2012-06-20 | Disposition: A | Discharge status: Home / Self Care | Payer: Medicare Other | Source: Ambulatory Visit | Attending: Urology | Admitting: Urology

## 2012-06-20 DIAGNOSIS — M25559 Pain in unspecified hip: Secondary | ICD-10-CM | POA: Insufficient documentation

## 2012-06-20 DIAGNOSIS — C61 Malignant neoplasm of prostate: Secondary | ICD-10-CM | POA: Insufficient documentation

## 2012-06-20 MED ORDER — TECHNETIUM TC 99M MEDRONATE IV KIT
25.0000 | PACK | Freq: Once | INTRAVENOUS | Status: AC | PRN
  Administered 2012-06-20: 25 via INTRAVENOUS

## 2012-06-28 ENCOUNTER — Other Ambulatory Visit: Payer: Self-pay | Admitting: Urology

## 2012-06-28 DIAGNOSIS — R948 Abnormal results of function studies of other organs and systems: Secondary | ICD-10-CM

## 2012-07-04 ENCOUNTER — Ambulatory Visit (HOSPITAL_COMMUNITY): Admission: RE | Admit: 2012-07-04 | Payer: Medicare Other | Source: Ambulatory Visit

## 2013-01-20 ENCOUNTER — Telehealth: Payer: Self-pay | Admitting: Oncology

## 2013-01-20 NOTE — Telephone Encounter (Signed)
S/W PT'S WIFE IN REF TO NP APPT. ON 02/03/13@1 :30 REF. DR Tressia Miners MAILED NP PACKET

## 2013-01-20 NOTE — Telephone Encounter (Signed)
C/D / for appt. 02/03/13

## 2013-01-27 ENCOUNTER — Other Ambulatory Visit: Payer: Self-pay | Admitting: Oncology

## 2013-01-27 DIAGNOSIS — Z8546 Personal history of malignant neoplasm of prostate: Secondary | ICD-10-CM

## 2013-02-03 ENCOUNTER — Other Ambulatory Visit (HOSPITAL_BASED_OUTPATIENT_CLINIC_OR_DEPARTMENT_OTHER): Payer: Medicare Other | Admitting: Lab

## 2013-02-03 ENCOUNTER — Ambulatory Visit (HOSPITAL_BASED_OUTPATIENT_CLINIC_OR_DEPARTMENT_OTHER): Payer: Medicare Other | Admitting: Oncology

## 2013-02-03 ENCOUNTER — Ambulatory Visit: Payer: Medicare Other

## 2013-02-03 VITALS — BP 132/79 | HR 86 | Temp 98.5°F | Resp 20 | Ht 77.0 in | Wt 260.5 lb

## 2013-02-03 DIAGNOSIS — Z8546 Personal history of malignant neoplasm of prostate: Secondary | ICD-10-CM

## 2013-02-03 DIAGNOSIS — C61 Malignant neoplasm of prostate: Secondary | ICD-10-CM

## 2013-02-03 LAB — CBC WITH DIFFERENTIAL/PLATELET
Eosinophils Absolute: 0.2 10*3/uL (ref 0.0–0.5)
HCT: 43.6 % (ref 38.4–49.9)
LYMPH%: 32.4 % (ref 14.0–49.0)
MONO#: 0.8 10*3/uL (ref 0.1–0.9)
NEUT#: 2.5 10*3/uL (ref 1.5–6.5)
Platelets: 230 10*3/uL (ref 140–400)
RBC: 5.11 10*6/uL (ref 4.20–5.82)
WBC: 5.3 10*3/uL (ref 4.0–10.3)
lymph#: 1.7 10*3/uL (ref 0.9–3.3)

## 2013-02-03 LAB — COMPREHENSIVE METABOLIC PANEL (CC13)
Albumin: 3.4 g/dL — ABNORMAL LOW (ref 3.5–5.0)
CO2: 25 mEq/L (ref 22–29)
Calcium: 9.2 mg/dL (ref 8.4–10.4)
Chloride: 107 mEq/L (ref 98–109)
Glucose: 96 mg/dl (ref 70–140)
Potassium: 4.1 mEq/L (ref 3.5–5.1)
Sodium: 142 mEq/L (ref 136–145)
Total Bilirubin: 0.69 mg/dL (ref 0.20–1.20)
Total Protein: 7 g/dL (ref 6.4–8.3)

## 2013-02-03 NOTE — Progress Notes (Signed)
Reason for Referral: Prostate cancer.   HPI: 67 year old gentleman referred to me for the evaluation of prostate cancer. He is a gentleman with a past medical history significant for hypertension and diabetes was diagnosed with prostate cancer in 2006. At that time he presented with elevated PSA and subsequently underwent radical retropubic prostatectomy and pelvic lymph node dissection on November of 2006. His pathology revealed a prostate adenocarcinoma Gleason score 4+3 equals 7 involving both lobes on the right seminal vesicle. The margins were not involved and a left pelvic lymph node as well as a right 1 were biopsied and were negative for cancer involvement. He had an excellent PSA response but in 2008 his PSA started to rise again and received salvage radiation therapy. His PSA continued to be less than 1 but slowly rising and 2011 it was 0.4 a and have been rising slowly since that time. It went up to 0.59 in March of 2012 and 1.83 in 2013 and most recently in February of 2014 was 4.1 and his PSA was on 01/13/2013 was 7.4 indicating a doubling time of over 6 months. He did have a bone scan in February of 2014 which showed a single focus of increased uptake in the lower thoracic spine approximately around T10. But he did have an MRI in the [place] [facility] which showed no evidence of cancer in that area. He is under the care of Dr. Annabell Howells who was planning to start androgen deprivation in the near future after a restaging bone scan scheduled for December of this year. He was referred to me for the evaluation for possible additional treatment with chemotherapy.  Clinically, he has symptoms of chronic back pain that have not really changed likely arthritic in nature. He also had problems with incontinence since his prostate surgery. But he is not reporting any constitutional symptoms her weight loss or appetite changes is not reporting any major changes in his performance status and activity level. He  still able to walk and drive and have good appetite. Is not reporting any chest pain or difficulty breathing. That report any cough or hemoptysis. Set reporting any GI symptoms. He does not report any hematuria but definitely has incontinence and erectile dysfunction.   Past Medical History  Diagnosis Date  . Diverticulosis   . ED (erectile dysfunction)   . Diabetes mellitus   . Arthritis   . Cancer     PROSTATE  . Hypertension   . Hemorrhoids   . Dyslipidemia   :  Past Surgical History  Procedure Laterality Date  . Prostate surgery      PROSTATECTOMY  . Colonoscopy  2008    Dr.hung  :   Current Outpatient Prescriptions  Medication Sig Dispense Refill  . aspirin 81 MG tablet Take 81 mg by mouth daily.      . calcium-vitamin D (OSCAL WITH D) 500-200 MG-UNIT per tablet Take 1 tablet by mouth daily.        . fexofenadine (ALLEGRA) 180 MG tablet Take 180 mg by mouth daily.      Marland Kitchen loperamide (IMODIUM) 2 MG capsule Take 2 mg by mouth 4 (four) times daily as needed for diarrhea or loose stools. 1 capsule every 4 hours as needed for diarrhea. Not to exceed 8 tabs/24hr      . losartan (COZAAR) 100 MG tablet Take 100 mg by mouth daily.      . metFORMIN (GLUCOPHAGE) 500 MG tablet Take 500 mg by mouth 2 (two) times daily with a  meal.        . pravastatin (PRAVACHOL) 10 MG tablet Take 10 mg by mouth daily.      . psyllium (METAMUCIL) 58.6 % packet Take 1 packet by mouth daily.        Marland Kitchen zolpidem (AMBIEN CR) 6.25 MG CR tablet Take 10 mg by mouth at bedtime as needed.         No current facility-administered medications for this visit.    :  Allergies  Allergen Reactions  . Ace Inhibitors     Angioedema   :  Family History  Problem Relation Age of Onset  . Diabetes Mother   . Diabetes Sister   . Diabetes Brother   . Diabetes Maternal Aunt   . Diabetes Maternal Uncle   . Diabetes Maternal Grandmother   . Diabetes Maternal Grandfather   :  History   Social History  .  Marital Status: Married    Spouse Name: N/A    Number of Children: N/A  . Years of Education: N/A   Occupational History  . Not on file.   Social History Main Topics  . Smoking status: Former Games developer  . Smokeless tobacco: Never Used  . Alcohol Use: Not on file  . Drug Use: Not on file  . Sexual Activity: Not on file   Other Topics Concern  . Not on file   Social History Narrative  . No narrative on file  :  A comprehensive review of systems was negative.  Exam: Blood pressure 132/79, pulse 86, temperature 98.5 F (36.9 C), temperature source Oral, resp. rate 20, height 6\' 5"  (1.956 m), weight 260 lb 8 oz (118.162 kg). General appearance: alert, cooperative and appears stated age Head: Normocephalic, without obvious abnormality, atraumatic Throat: lips, mucosa, and tongue normal; teeth and gums normal Neck: no adenopathy, no carotid bruit, no JVD, supple, symmetrical, trachea midline and thyroid not enlarged, symmetric, no tenderness/mass/nodules Back: negative, symmetric, no curvature. ROM normal. No CVA tenderness. Resp: clear to auscultation bilaterally Cardio: regular rate and rhythm, S1, S2 normal, no murmur, click, rub or gallop GI: soft, non-tender; bowel sounds normal; no masses,  no organomegaly Extremities: extremities normal, atraumatic, no cyanosis or edema Pulses: 2+ and symmetric Skin: Skin color, texture, turgor normal. No rashes or lesions Lymph nodes: Cervical, supraclavicular, and axillary nodes normal.   Recent Labs  02/03/13 1345  WBC 5.3  HGB 14.1  HCT 43.6  PLT 230       Assessment and Plan:   67 year old gentleman with the following issues:  1. Prostate cancer diagnosed in November of 2006 presented with a Gleason score 4+3 equals 7 disease. He is status post radical prostatectomy with a pathological stage T3b N0. He developed relapsed disease with a rise in his PSA in 2008 and received salvage radiation therapy. Most recently his PSA had  been rising with a doubling time more than 6 months with most recent PSA in August of 2014 of 7.4. He does not have measurable disease based on a bone scan that was done in February of 2014. He is planned to have a staging workup in December of this year. The natural course of prostate cancer in general was discussed in detail with Mr. Heal and his wife. I've explained to him that if his staging workup reveals no measurable metastatic disease than we are probably dealing with biochemical relapse only. I do agree with starting androgen depravation once the PSA reaches a certain predetermined and agreed upon threshold. PSA  of 20 is a reasonable cutoff  at this point. I explained to him the role of systemic chemotherapy in the treatment of advanced prostate cancer. The role of Taxotere chemotherapy have been limited to the castration resistant disease until recently where data suggesting that use of this drug in combination with androgen depravation upfront setting have extended survival for close to 13 months. However, the benefit is restricted to patient population that has measurable disease that includes of visceral metastasis to lymph nodes and solid organs there were multiple bone lesions. I told him if he has any of these criteria based on his staging workup then we'll for sure consider him for treatments. I've discussed with him the risks and benefits of Taxotere chemotherapy as well as the logistics of this particular approach. I feel he'll be a great candidate for it if the criteria mentioned above that's with him. I feel that he might all have all biochemical relapse and in that case I would not treat him with any systemic chemotherapy and defer that to a later date.  2. Androgen deprivation he is plan to start that in the near future under the care of Dr. Annabell Howells I have no objections for that at this point I feel that we'll probably drop down his PSA rather dramatically although it will not offer any  cure.  All his questions were answered today and I will evaluate him in the future after his staging workups have been done and if indeed has multiple metastatic disease response.

## 2013-02-04 LAB — PSA: PSA: 7.5 ng/mL — ABNORMAL HIGH (ref <=4.00)

## 2013-02-04 LAB — TESTOSTERONE: Testosterone: 208 ng/dL — ABNORMAL LOW (ref 300–890)

## 2013-02-06 ENCOUNTER — Encounter: Payer: Self-pay | Admitting: Family Medicine

## 2013-02-06 ENCOUNTER — Ambulatory Visit (INDEPENDENT_AMBULATORY_CARE_PROVIDER_SITE_OTHER): Payer: Medicare Other | Admitting: Family Medicine

## 2013-02-06 VITALS — BP 122/80 | HR 84 | Ht 75.5 in | Wt 264.0 lb

## 2013-02-06 DIAGNOSIS — Z79899 Other long term (current) drug therapy: Secondary | ICD-10-CM

## 2013-02-06 DIAGNOSIS — H409 Unspecified glaucoma: Secondary | ICD-10-CM

## 2013-02-06 DIAGNOSIS — Z8546 Personal history of malignant neoplasm of prostate: Secondary | ICD-10-CM

## 2013-02-06 DIAGNOSIS — I10 Essential (primary) hypertension: Secondary | ICD-10-CM

## 2013-02-06 DIAGNOSIS — Z23 Encounter for immunization: Secondary | ICD-10-CM

## 2013-02-06 DIAGNOSIS — E119 Type 2 diabetes mellitus without complications: Secondary | ICD-10-CM

## 2013-02-06 DIAGNOSIS — E1159 Type 2 diabetes mellitus with other circulatory complications: Secondary | ICD-10-CM

## 2013-02-06 DIAGNOSIS — E1169 Type 2 diabetes mellitus with other specified complication: Secondary | ICD-10-CM

## 2013-02-06 DIAGNOSIS — E785 Hyperlipidemia, unspecified: Secondary | ICD-10-CM

## 2013-02-06 NOTE — Progress Notes (Signed)
Subjective:    Patient ID: [name], male    DOB: [birth year], 67 y.o.   MRN: [id number]  HPI He is here for medication check. He is still dealing with prostate cancer. He had a prostatectomy and several years after that also had radiation due to the increasing PSA. His PSA continues to rise. He is now scheduled to see oncology for followup on this. He is also had a bone scan within the last year. He does have diabetes and keeps himself active. He does periodically check his blood sugars. He is taking care of at the [place] for this. He also has underlying glaucoma and has had an eye exam within the last year. He continues on his blood pressure as well as diabetes medications and is having no difficulty with them. He does have some arthritis especially in his back and does occasionally complain of bilateral thigh discomfort with activities. He rarely uses his Ambien. Social and family history were reviewed and are unchanged.   Review of Systems  Constitutional: Negative.   HENT: Negative.   Eyes: Negative.   Respiratory: Negative.   Cardiovascular: Negative.   Gastrointestinal: Negative.   Endocrine: Negative.   Genitourinary: Negative.   Musculoskeletal: Positive for back pain.  Skin: Negative.   Allergic/Immunologic: Negative.   Hematological: Negative.   Psychiatric/Behavioral: Negative.        Objective:   Physical Exam BP 122/80  Pulse 84  Ht 6' 3.5" (1.918 m)  Wt 264 lb (119.75 kg)  BMI 32.55 kg/m2  General Appearance:    Alert, cooperative, no distress, appears stated age  Head:    Normocephalic, without obvious abnormality, atraumatic  Eyes:    PERRL, conjunctiva/corneas clear, EOM's intact, fundi    benign  Ears:    Normal TM's and external ear canals  Nose:   Nares normal, mucosa normal, no drainage or sinus   tenderness  Throat:   Lips, mucosa, and tongue normal; teeth and gums normal  Neck:   Supple, no lymphadenopathy;  thyroid:  no    enlargement/tenderness/nodules; no carotid   bruit or JVD  Back:    Spine nontender, no curvature, ROM normal, no CVA     tenderness  Lungs:     Clear to auscultation bilaterally without wheezes, rales or     ronchi; respirations unlabored  Chest Wall:    No tenderness or deformity   Heart:    Regular rate and rhythm, S1 and S2 normal, no murmur, rub   or gallop  Breast Exam:    No chest wall tenderness, masses or gynecomastia  Abdomen:     Soft, non-tender, nondistended, normoactive bowel sounds,    no masses, no hepatosplenomegaly        Extremities:   No clubbing, cyanosis or edema  Pulses:   2+ and symmetric all extremities  Skin:   Skin color, texture, turgor normal, no rashes or lesions  Lymph nodes:   Cervical, supraclavicular, and axillary nodes normal  Neurologic:   CNII-XII intact, normal strength, sensation and gait; reflexes 2+ and symmetric throughout          Psych:   Normal mood, affect, hygiene and grooming.    Hemoglobin A1c is 6.1.     Assessment & Plan:  Diabetes mellitus - Plan: POCT glycosylated hemoglobin (Hb A1C)  Need for prophylactic vaccination and inoculation against influenza - Plan: Flu Vaccine QUAD 36+ mos IM  Glaucoma  History of prostate cancer  Hyperlipidemia LDL goal <70 - Plan: Lipid  panel  Hypertension associated with diabetes  Encounter for long-term (current) use of other medications  flu shot given. The geriatric dosing is still not here. Recommend he take the blood work from last week and his lipid panel today as well as hemoglobin A1c to the [place]. Flu shot given with risks and benefits discussed.

## 2013-02-07 LAB — LIPID PANEL
LDL Cholesterol: 69 mg/dL (ref 0–99)
VLDL: 8 mg/dL (ref 0–40)

## 2013-03-29 ENCOUNTER — Other Ambulatory Visit: Payer: Self-pay | Admitting: Urology

## 2013-03-29 DIAGNOSIS — C61 Malignant neoplasm of prostate: Secondary | ICD-10-CM

## 2013-04-18 ENCOUNTER — Other Ambulatory Visit (HOSPITAL_COMMUNITY): Payer: Medicare Other

## 2013-08-10 ENCOUNTER — Telehealth: Payer: Self-pay

## 2013-08-10 NOTE — Telephone Encounter (Signed)
Spoke with [facility] and they stated we will have to do the referral on their website for patients. They have extended this patients coverage unitl May 1, after May 1 I will call David Stall at [phone] to have referral sent in through the website.

## 2013-08-10 NOTE — Telephone Encounter (Signed)
Pt called stating that since he has switched insurances that [facility] will now need a referral for him to continue to see them. Is this ok for me to call and give them authorization to continue to see pt for Prostate Cancer?

## 2013-08-10 NOTE — Telephone Encounter (Signed)
Yes ;go ahead and set this up

## 2014-02-06 ENCOUNTER — Ambulatory Visit (INDEPENDENT_AMBULATORY_CARE_PROVIDER_SITE_OTHER): Payer: Commercial Managed Care - HMO | Admitting: Family Medicine

## 2014-02-06 ENCOUNTER — Encounter: Payer: Self-pay | Admitting: Family Medicine

## 2014-02-06 VITALS — BP 122/80 | HR 76 | Ht 76.0 in | Wt 265.0 lb

## 2014-02-06 DIAGNOSIS — J309 Allergic rhinitis, unspecified: Secondary | ICD-10-CM

## 2014-02-06 DIAGNOSIS — E119 Type 2 diabetes mellitus without complications: Secondary | ICD-10-CM

## 2014-02-06 DIAGNOSIS — Z23 Encounter for immunization: Secondary | ICD-10-CM

## 2014-02-06 DIAGNOSIS — E1136 Type 2 diabetes mellitus with diabetic cataract: Secondary | ICD-10-CM | POA: Insufficient documentation

## 2014-02-06 DIAGNOSIS — Z8546 Personal history of malignant neoplasm of prostate: Secondary | ICD-10-CM

## 2014-02-06 DIAGNOSIS — M1990 Unspecified osteoarthritis, unspecified site: Secondary | ICD-10-CM

## 2014-02-06 DIAGNOSIS — E785 Hyperlipidemia, unspecified: Secondary | ICD-10-CM

## 2014-02-06 DIAGNOSIS — H269 Unspecified cataract: Secondary | ICD-10-CM

## 2014-02-06 DIAGNOSIS — E1169 Type 2 diabetes mellitus with other specified complication: Secondary | ICD-10-CM

## 2014-02-06 DIAGNOSIS — H409 Unspecified glaucoma: Secondary | ICD-10-CM

## 2014-02-06 DIAGNOSIS — I10 Essential (primary) hypertension: Secondary | ICD-10-CM

## 2014-02-06 DIAGNOSIS — E1159 Type 2 diabetes mellitus with other circulatory complications: Secondary | ICD-10-CM

## 2014-02-06 DIAGNOSIS — M129 Arthropathy, unspecified: Secondary | ICD-10-CM

## 2014-02-06 NOTE — Progress Notes (Signed)
   Subjective:    Patient ID: [name], male    DOB: 01/01/1946, 68 y.o.   MRN: [id number]  HPI He is here for medication check. He gets most of his medical care at the [place]. They're handling his hypertension/hyperlipidemia and diabetes. His last hemoglobin A1c was 6.6. Presently he is on glipizide and metformin. He does not smoke or drink and exercises daily with walking. He has a history of cataracts and glaucoma. Surgery was recommended however he is going to hold off on cataract surgery for now. He does use Ambien but very sparingly. He is also followed here in [facility] for prostate cancer and gets PSA testing every 3 months. He would like to go ahead and get a PSA done today. His allergies are under good control. He has no arthritic complaints today. He has no other concerns or complaints. Family and social history were reviewed.   Review of Systems  All other systems reviewed and are negative.      Objective:   Physical Exam alert and in no distress. Tympanic membranes and canals are normal. Throat is clear. Tonsils are normal. Neck is supple without adenopathy or thyromegaly. Cardiac exam shows a regular sinus rhythm without murmurs or gallops. Lungs are clear to auscultation. Abdominal exam shows no hepatosplenomegaly, masses or tenderness with normal bowel sounds        Assessment & Plan:  Hypertension associated with diabetes  Hyperlipidemia LDL goal <70  History of prostate cancer - Plan: PSA  Type 2 diabetes mellitus without complication  Arthritis  Glaucoma  Need for prophylactic vaccination and inoculation against influenza - Plan: Flu vaccine HIGH DOSE PF (Fluzone Tri High dose)  Need for prophylactic vaccination against Streptococcus pneumoniae (pneumococcus) - Plan: Pneumococcal conjugate vaccine 13-valent  Cataracts, bilateral  Allergic rhinitis, unspecified allergic rhinitis type  I will order a PSA and update his immunizations. He will continue to  be followed by the [facility] for his general medical care and followup with urology here in [place].

## 2014-02-07 LAB — PSA: PSA: 9.47 ng/mL — ABNORMAL HIGH (ref <=4.00)

## 2014-05-23 DIAGNOSIS — C61 Malignant neoplasm of prostate: Secondary | ICD-10-CM | POA: Diagnosis not present

## 2014-05-30 DIAGNOSIS — C61 Malignant neoplasm of prostate: Secondary | ICD-10-CM | POA: Diagnosis not present

## 2014-05-30 DIAGNOSIS — N393 Stress incontinence (female) (male): Secondary | ICD-10-CM | POA: Diagnosis not present

## 2014-09-10 DIAGNOSIS — N5201 Erectile dysfunction due to arterial insufficiency: Secondary | ICD-10-CM | POA: Diagnosis not present

## 2014-09-10 DIAGNOSIS — C61 Malignant neoplasm of prostate: Secondary | ICD-10-CM | POA: Diagnosis not present

## 2014-09-10 DIAGNOSIS — N393 Stress incontinence (female) (male): Secondary | ICD-10-CM | POA: Diagnosis not present

## 2014-11-06 ENCOUNTER — Telehealth: Payer: Self-pay | Admitting: Internal Medicine

## 2014-11-06 NOTE — Telephone Encounter (Signed)
A referral has been done for pt's [employer] insurance to Dr. Jeffie Pollock (urology) for august.

## 2015-01-02 DIAGNOSIS — C61 Malignant neoplasm of prostate: Secondary | ICD-10-CM | POA: Diagnosis not present

## 2015-01-09 DIAGNOSIS — C61 Malignant neoplasm of prostate: Secondary | ICD-10-CM | POA: Diagnosis not present

## 2015-01-09 DIAGNOSIS — N393 Stress incontinence (female) (male): Secondary | ICD-10-CM | POA: Diagnosis not present

## 2015-01-09 DIAGNOSIS — N5201 Erectile dysfunction due to arterial insufficiency: Secondary | ICD-10-CM | POA: Diagnosis not present

## 2015-02-08 ENCOUNTER — Encounter: Payer: Commercial Managed Care - HMO | Admitting: Family Medicine

## 2015-02-12 ENCOUNTER — Encounter: Payer: Self-pay | Admitting: Family Medicine

## 2015-02-12 ENCOUNTER — Ambulatory Visit (INDEPENDENT_AMBULATORY_CARE_PROVIDER_SITE_OTHER): Payer: Commercial Managed Care - HMO | Admitting: Family Medicine

## 2015-02-12 VITALS — BP 126/80 | HR 64 | Ht 76.0 in | Wt 274.0 lb

## 2015-02-12 DIAGNOSIS — I10 Essential (primary) hypertension: Secondary | ICD-10-CM

## 2015-02-12 DIAGNOSIS — E1136 Type 2 diabetes mellitus with diabetic cataract: Secondary | ICD-10-CM | POA: Diagnosis not present

## 2015-02-12 DIAGNOSIS — I152 Hypertension secondary to endocrine disorders: Secondary | ICD-10-CM

## 2015-02-12 DIAGNOSIS — Z8546 Personal history of malignant neoplasm of prostate: Secondary | ICD-10-CM | POA: Diagnosis not present

## 2015-02-12 DIAGNOSIS — M1990 Unspecified osteoarthritis, unspecified site: Secondary | ICD-10-CM | POA: Diagnosis not present

## 2015-02-12 DIAGNOSIS — E1159 Type 2 diabetes mellitus with other circulatory complications: Secondary | ICD-10-CM

## 2015-02-12 DIAGNOSIS — E1169 Type 2 diabetes mellitus with other specified complication: Secondary | ICD-10-CM

## 2015-02-12 DIAGNOSIS — Z23 Encounter for immunization: Secondary | ICD-10-CM | POA: Diagnosis not present

## 2015-02-12 DIAGNOSIS — J301 Allergic rhinitis due to pollen: Secondary | ICD-10-CM

## 2015-02-12 DIAGNOSIS — E118 Type 2 diabetes mellitus with unspecified complications: Secondary | ICD-10-CM

## 2015-02-12 DIAGNOSIS — H409 Unspecified glaucoma: Secondary | ICD-10-CM

## 2015-02-12 DIAGNOSIS — E785 Hyperlipidemia, unspecified: Secondary | ICD-10-CM

## 2015-02-12 LAB — COMPREHENSIVE METABOLIC PANEL
ALBUMIN: 4 g/dL (ref 3.6–5.1)
ALK PHOS: 79 U/L (ref 40–115)
ALT: 16 U/L (ref 9–46)
AST: 21 U/L (ref 10–35)
BILIRUBIN TOTAL: 0.8 mg/dL (ref 0.2–1.2)
BUN: 17 mg/dL (ref 7–25)
CALCIUM: 9.5 mg/dL (ref 8.6–10.3)
CO2: 26 mmol/L (ref 20–31)
CREATININE: 1.35 mg/dL — AB (ref 0.70–1.18)
Chloride: 105 mmol/L (ref 98–110)
Glucose, Bld: 82 mg/dL (ref 65–99)
Potassium: 4 mmol/L (ref 3.5–5.3)
SODIUM: 139 mmol/L (ref 135–146)
TOTAL PROTEIN: 7.1 g/dL (ref 6.1–8.1)

## 2015-02-12 LAB — LIPID PANEL
Cholesterol: 136 mg/dL (ref 125–200)
HDL: 55 mg/dL (ref >=40)
LDL CALC: 70 mg/dL (ref <130)
TRIGLYCERIDES: 56 mg/dL (ref <150)
Total CHOL/HDL Ratio: 2.5 Ratio (ref <=5.0)
VLDL: 11 mg/dL (ref <30)

## 2015-02-12 LAB — CBC WITH DIFFERENTIAL/PLATELET
BASOS ABS: 0.1 10*3/uL (ref 0.0–0.1)
BASOS PCT: 1 % (ref 0–1)
Eosinophils Absolute: 0.2 10*3/uL (ref 0.0–0.7)
Eosinophils Relative: 3 % (ref 0–5)
HEMATOCRIT: 43 % (ref 39.0–52.0)
HEMOGLOBIN: 14.4 g/dL (ref 13.0–17.0)
LYMPHS PCT: 36 % (ref 12–46)
Lymphs Abs: 2.3 10*3/uL (ref 0.7–4.0)
MCH: 28.3 pg (ref 26.0–34.0)
MCHC: 33.5 g/dL (ref 30.0–36.0)
MCV: 84.6 fL (ref 78.0–100.0)
MONO ABS: 0.6 10*3/uL (ref 0.1–1.0)
MPV: 10.1 fL (ref 8.6–12.4)
Monocytes Relative: 9 % (ref 3–12)
NEUTROS ABS: 3.3 10*3/uL (ref 1.7–7.7)
NEUTROS PCT: 51 % (ref 43–77)
Platelets: 231 10*3/uL (ref 150–400)
RBC: 5.08 MIL/uL (ref 4.22–5.81)
RDW: 15.9 % — ABNORMAL HIGH (ref 11.5–15.5)
WBC: 6.4 10*3/uL (ref 4.0–10.5)

## 2015-02-12 LAB — POCT UA - MICROALBUMIN
ALBUMIN/CREATININE RATIO, URINE, POC: 3.5
Creatinine, POC: 149.2 mg/dL
Microalbumin Ur, POC: 5.2 mg/L

## 2015-02-12 LAB — POCT GLYCOSYLATED HEMOGLOBIN (HGB A1C): HEMOGLOBIN A1C: 6.1

## 2015-02-12 NOTE — Progress Notes (Signed)
   Subjective:    Patient ID: [name], male    DOB: Jan 22, 1946, 69 y.o.   MRN: [id number]  HPI He is here for interval evaluation. He usually gets his care at the [place]. He is scheduled for cataract surgery October 21. He also has a history of glaucoma. He normally is followed there for his diabetes but missed his last appointment. He would like some follow-up blood work. He has had an abdominal ultrasound done in the past. He also has underlying prostate cancer and has had previous surgery, radiation. His last PSA was 14. They're considering hormone therapy. He does have residual incontinence from this. He does check his blood sugars regularly and they are in the low 100s. He checks his feet daily. He jogs regularly. Smoking and drinking were reviewed and are not an issue. In general he is doing quite well. His allergies are under good control. Arthritis is seen not an issue since he does regularly exercise. He has no other concerns or complaints.   Review of Systems     Objective:   Physical Exam Alert and in no distress. Tympanic membranes and canals are normal. Pharyngeal area is normal. Neck is supple without adenopathy or thyromegaly. Cardiac exam shows a normal resume with an occasional missed beat without murmurs or gallops. Lungs are clear to auscultation. Abdominal exam shows no masses or tenderness. Hemoglobin A1c is 6.1       Assessment & Plan:  Diabetes mellitus type 2 with complications - Plan: POCT glycosylated hemoglobin (Hb A1C), POCT UA - Microalbumin, CBC with Differential/Platelet, Comprehensive metabolic panel, Lipid panel  Need for prophylactic vaccination and inoculation against influenza - Plan: Flu vaccine HIGH DOSE PF (Fluzone High dose)  Hypertension associated with diabetes - Plan: CBC with Differential/Platelet, Comprehensive metabolic panel  Hyperlipidemia LDL goal <70 - Plan: Lipid panel  History of prostate cancer - Plan: CBC with  Differential/Platelet, Comprehensive metabolic panel  Glaucoma  Arthritis  Cataract associated with type 2 diabetes mellitus  Allergic rhinitis due to pollen  he'll continue on his present medications. Encouraged him continue with his physical activity. He will get the majority of his care at the [place]. When I mention about heart he says that the [place] knows about this. Follow-up here as needed. Over 45 minutes, greater than 50% spent in counseling and coordination of care.

## 2015-04-22 DIAGNOSIS — N5201 Erectile dysfunction due to arterial insufficiency: Secondary | ICD-10-CM | POA: Diagnosis not present

## 2015-04-22 DIAGNOSIS — C61 Malignant neoplasm of prostate: Secondary | ICD-10-CM | POA: Diagnosis not present

## 2015-04-22 DIAGNOSIS — N393 Stress incontinence (female) (male): Secondary | ICD-10-CM | POA: Diagnosis not present

## 2015-05-28 DIAGNOSIS — H2512 Age-related nuclear cataract, left eye: Secondary | ICD-10-CM | POA: Insufficient documentation

## 2015-05-28 DIAGNOSIS — H35371 Puckering of macula, right eye: Secondary | ICD-10-CM | POA: Insufficient documentation

## 2015-07-02 ENCOUNTER — Telehealth: Payer: Self-pay | Admitting: Family Medicine

## 2015-07-02 NOTE — Telephone Encounter (Signed)
[employer] records request for [place]

## 2015-08-19 ENCOUNTER — Telehealth: Payer: Self-pay | Admitting: Family Medicine

## 2015-08-19 DIAGNOSIS — C61 Malignant neoplasm of prostate: Secondary | ICD-10-CM | POA: Diagnosis not present

## 2015-08-19 NOTE — Telephone Encounter (Signed)
This was done through [facility].  Approval # [id number]

## 2015-08-19 NOTE — Telephone Encounter (Signed)
Pt called and states he is going for labs today and has appt on 4/10 with Dr. Roni Bread at [facility].  He needs a referral.  Dx:  Cancer

## 2015-08-26 DIAGNOSIS — Z0000 Encounter for general adult medical examination without abnormal findings: Secondary | ICD-10-CM | POA: Diagnosis not present

## 2015-08-26 DIAGNOSIS — C61 Malignant neoplasm of prostate: Secondary | ICD-10-CM | POA: Diagnosis not present

## 2015-08-26 DIAGNOSIS — N5201 Erectile dysfunction due to arterial insufficiency: Secondary | ICD-10-CM | POA: Diagnosis not present

## 2015-08-26 DIAGNOSIS — N393 Stress incontinence (female) (male): Secondary | ICD-10-CM | POA: Diagnosis not present

## 2015-08-27 ENCOUNTER — Encounter: Payer: Self-pay | Admitting: *Deleted

## 2015-09-13 LAB — HM DIABETES EYE EXAM

## 2015-12-23 DIAGNOSIS — C61 Malignant neoplasm of prostate: Secondary | ICD-10-CM | POA: Diagnosis not present

## 2016-01-13 DIAGNOSIS — C61 Malignant neoplasm of prostate: Secondary | ICD-10-CM | POA: Diagnosis not present

## 2016-01-13 DIAGNOSIS — N393 Stress incontinence (female) (male): Secondary | ICD-10-CM | POA: Diagnosis not present

## 2016-01-13 DIAGNOSIS — N5201 Erectile dysfunction due to arterial insufficiency: Secondary | ICD-10-CM | POA: Diagnosis not present

## 2016-02-13 ENCOUNTER — Ambulatory Visit (INDEPENDENT_AMBULATORY_CARE_PROVIDER_SITE_OTHER): Payer: Commercial Managed Care - HMO | Admitting: Family Medicine

## 2016-02-13 ENCOUNTER — Encounter: Payer: Self-pay | Admitting: Family Medicine

## 2016-02-13 VITALS — BP 118/70 | HR 78 | Ht 76.0 in | Wt 252.4 lb

## 2016-02-13 DIAGNOSIS — E1159 Type 2 diabetes mellitus with other circulatory complications: Secondary | ICD-10-CM | POA: Diagnosis not present

## 2016-02-13 DIAGNOSIS — Z23 Encounter for immunization: Secondary | ICD-10-CM

## 2016-02-13 DIAGNOSIS — I10 Essential (primary) hypertension: Secondary | ICD-10-CM | POA: Diagnosis not present

## 2016-02-13 DIAGNOSIS — E1169 Type 2 diabetes mellitus with other specified complication: Secondary | ICD-10-CM

## 2016-02-13 DIAGNOSIS — H409 Unspecified glaucoma: Secondary | ICD-10-CM

## 2016-02-13 DIAGNOSIS — H42 Glaucoma in diseases classified elsewhere: Secondary | ICD-10-CM

## 2016-02-13 DIAGNOSIS — E785 Hyperlipidemia, unspecified: Secondary | ICD-10-CM

## 2016-02-13 DIAGNOSIS — E118 Type 2 diabetes mellitus with unspecified complications: Secondary | ICD-10-CM

## 2016-02-13 DIAGNOSIS — J301 Allergic rhinitis due to pollen: Secondary | ICD-10-CM

## 2016-02-13 DIAGNOSIS — Z1159 Encounter for screening for other viral diseases: Secondary | ICD-10-CM

## 2016-02-13 DIAGNOSIS — M1990 Unspecified osteoarthritis, unspecified site: Secondary | ICD-10-CM

## 2016-02-13 DIAGNOSIS — Z8546 Personal history of malignant neoplasm of prostate: Secondary | ICD-10-CM

## 2016-02-13 DIAGNOSIS — E1139 Type 2 diabetes mellitus with other diabetic ophthalmic complication: Secondary | ICD-10-CM | POA: Diagnosis not present

## 2016-02-13 DIAGNOSIS — B182 Chronic viral hepatitis C: Secondary | ICD-10-CM | POA: Diagnosis not present

## 2016-02-13 LAB — CBC WITH DIFFERENTIAL/PLATELET
BASOS ABS: 49 {cells}/uL (ref 0–200)
BASOS PCT: 1 %
EOS PCT: 4 %
Eosinophils Absolute: 196 cells/uL (ref 15–500)
HCT: 43.3 % (ref 38.5–50.0)
HEMOGLOBIN: 14.2 g/dL (ref 13.2–17.1)
LYMPHS ABS: 1617 {cells}/uL (ref 850–3900)
Lymphocytes Relative: 33 %
MCH: 28.6 pg (ref 27.0–33.0)
MCHC: 32.8 g/dL (ref 32.0–36.0)
MCV: 87.3 fL (ref 80.0–100.0)
MPV: 11 fL (ref 7.5–12.5)
Monocytes Absolute: 686 cells/uL (ref 200–950)
Monocytes Relative: 14 %
NEUTROS ABS: 2352 {cells}/uL (ref 1500–7800)
Neutrophils Relative %: 48 %
Platelets: 215 10*3/uL (ref 140–400)
RBC: 4.96 MIL/uL (ref 4.20–5.80)
RDW: 16 % — ABNORMAL HIGH (ref 11.0–15.0)
WBC: 4.9 10*3/uL (ref 4.0–10.5)

## 2016-02-13 LAB — LIPID PANEL
CHOLESTEROL: 130 mg/dL (ref 125–200)
HDL: 56 mg/dL (ref >=40)
LDL Cholesterol: 65 mg/dL (ref <130)
Total CHOL/HDL Ratio: 2.3 Ratio (ref <=5.0)
Triglycerides: 43 mg/dL (ref <150)
VLDL: 9 mg/dL (ref <30)

## 2016-02-13 LAB — COMPREHENSIVE METABOLIC PANEL
ALBUMIN: 4 g/dL (ref 3.6–5.1)
ALK PHOS: 72 U/L (ref 40–115)
ALT: 18 U/L (ref 9–46)
AST: 28 U/L (ref 10–35)
BILIRUBIN TOTAL: 1 mg/dL (ref 0.2–1.2)
BUN: 13 mg/dL (ref 7–25)
CO2: 25 mmol/L (ref 20–31)
CREATININE: 1.13 mg/dL (ref 0.70–1.18)
Calcium: 9.2 mg/dL (ref 8.6–10.3)
Chloride: 104 mmol/L (ref 98–110)
Glucose, Bld: 115 mg/dL — ABNORMAL HIGH (ref 65–99)
Potassium: 4.5 mmol/L (ref 3.5–5.3)
SODIUM: 141 mmol/L (ref 135–146)
TOTAL PROTEIN: 6.9 g/dL (ref 6.1–8.1)

## 2016-02-13 LAB — POCT UA - MICROALBUMIN: CREATININE, POC: 145.4 mg/dL

## 2016-02-13 LAB — POCT GLYCOSYLATED HEMOGLOBIN (HGB A1C): Hemoglobin A1C: 6.2

## 2016-02-13 NOTE — Progress Notes (Signed)
Subjective:   HPI  [name] is a 70 y.o. male who presents for a complete physical.  Medical care team includes:  [facility] %   [facility] [place] Preventative care: Last ophthalmology visit:10/25/2015 Last dental visit: N/A Last colonoscopy:10/14/06 Last prostate exam:  12/2015 He is followed by Dr. Jeffie Pollock and at the [place] EKG:10/19/11 Last labs: 02/12/15  Prior vaccinations: TD or Tdap:02/12/15 Influenza:02/12/15 Pneumococcal:23:02/04/12 13:02/06/14 Shingles/Zostavax: 03/25/06  Advanced directive: Yes Health care power of attorney: Concerns: No particular concerns. He is seen regularly at the [place]. He has had cataract surgery bilaterally and is also being followed for glaucoma at [facility]. Dr. Roni Bread sees him for prostate cancer. He states that his last hemoglobin A1C at the [place] was 6.6. He is now retired and is also involved in an exercise program as well as dietary modification. He is lost from a waist of 44 down to 40. He continues on his pressure medications as well as statin and having no difficulty with them. He also is taking metformin as well as glipizide for his diabetes. He does have underlying arthritis but has no complaints today. Allergies are giving him very little difficulty. He has had a AAA evaluation at the [place] was negative He has no other concerns or complaints.  Reviewed their medical, surgical, family, social, medication, and allergy history and updated chart as appropriate. Review of systems is negative except as above      Objective:   Physical Exam General appearance: alert, no distress, WD/WN,  Skin:Normal HEENT: normocephalic, conjunctiva/corneas normal, sclerae anicteric, PERRLA, EOMi, nares patent, no discharge or erythema, pharynx normal Oral cavity: MMM, tongue normal, teeth normal Neck: supple, no lymphadenopathy, no thyromegaly, no masses, normal ROM Chest: non tender, normal shape and expansion Heart: Slightly irregular rate with a  compensatory pause (PVC), normal S1, S2, no murmurs Lungs: CTA bilaterally, no wheezes, rhonchi, or rales Abdomen: +bs, soft, non tender, non distended, no masses, no hepatomegaly, no splenomegaly, no bruits Back: non tender, normal ROM, no scoliosis Musculoskeletal: upper extremities non tender, no obvious deformity, normal ROM throughout, lower extremities non tender, no obvious deformity, normal ROM throughout Extremities: no edema, no cyanosis, no clubbing Pulses: 2+ symmetric, upper and lower extremities, normal cap refill Neurological: alert, oriented x 3, CN2-12 intact, strength normal upper extremities and lower extremities, sensation normal throughout, DTRs 2+ throughout, no cerebellar signs, gait normal Psychiatric: normal affect, behavior normal, pleasant  hemoglobin A1c is 6.2 Assessment and Plan :   Hypertension associated with diabetes ([place]) - Plan: CBC with Differential/Platelet, Comprehensive metabolic panel  Allergic rhinitis due to pollen  Type 2 diabetes mellitus with complication, without long-term current use of insulin ([facility]) - Plan: CBC with Differential/Platelet, Comprehensive metabolic panel, Lipid panel, POCT UA - Microalbumin  Arthritis  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 ([place])  Need for hepatitis C screening test - Plan: Hepatitis C antibody  Need for prophylactic vaccination and inoculation against influenza - Plan: Flu vaccine HIGH DOSE PF (Fluzone High dose), Lipid panel  Hyperlipidemia associated with type 2 diabetes mellitus ([place])  History of prostate cancer Overall he is taking very good care of himself stressing in regard to his diet and exercise. I encouraged him to continue with that.   Physical exam - discussed healthy lifestyle, diet, exercise, preventative care, vaccinations, and addressed their concerns.     Follow-up at the [place] in several months and here in one year. He will continue to be followed there and get his  medications  renewed.

## 2016-02-14 ENCOUNTER — Other Ambulatory Visit: Payer: Self-pay

## 2016-02-14 ENCOUNTER — Other Ambulatory Visit: Payer: Commercial Managed Care - HMO

## 2016-02-14 DIAGNOSIS — R899 Unspecified abnormal finding in specimens from other organs, systems and tissues: Secondary | ICD-10-CM | POA: Diagnosis not present

## 2016-02-14 DIAGNOSIS — B182 Chronic viral hepatitis C: Secondary | ICD-10-CM | POA: Diagnosis not present

## 2016-02-14 LAB — HEPATITIS C ANTIBODY: HCV AB: REACTIVE — AB

## 2016-02-18 LAB — HEPATITIS C RNA QUANTITATIVE: HCV QUANT: NOT DETECTED [IU]/mL (ref <15)

## 2016-02-19 DIAGNOSIS — Z8619 Personal history of other infectious and parasitic diseases: Secondary | ICD-10-CM | POA: Insufficient documentation

## 2016-02-19 DIAGNOSIS — B1920 Unspecified viral hepatitis C without hepatic coma: Secondary | ICD-10-CM

## 2016-02-19 LAB — HEPATITIS C VRS RNA DETECT BY PCR-QUAL: Hepatitis C Vrs RNA by PCR-Qual: NOT DETECTED

## 2016-02-20 LAB — HEPATITIS C AB W/RFL RNA, PCR + GENO
Hepatitis C Ab: REACTIVE — AB
Signal to Cutoff: 1.34 ratio — ABNORMAL HIGH (ref <1.00)

## 2016-02-20 LAB — HCV RNA, QN PCR RFLX GENO, LIPA

## 2016-04-23 ENCOUNTER — Telehealth: Payer: Self-pay

## 2016-04-23 NOTE — Telephone Encounter (Signed)
Pt needs [employer] referral to Dr. Jeffie Pollock.   Thank you, Wells Guiles

## 2016-04-23 NOTE — Telephone Encounter (Signed)
Referral put in [place] # [id number]

## 2016-06-24 DIAGNOSIS — C61 Malignant neoplasm of prostate: Secondary | ICD-10-CM | POA: Diagnosis not present

## 2016-07-01 DIAGNOSIS — N393 Stress incontinence (female) (male): Secondary | ICD-10-CM | POA: Diagnosis not present

## 2016-07-01 DIAGNOSIS — C61 Malignant neoplasm of prostate: Secondary | ICD-10-CM | POA: Diagnosis not present

## 2016-07-01 DIAGNOSIS — N5201 Erectile dysfunction due to arterial insufficiency: Secondary | ICD-10-CM | POA: Diagnosis not present

## 2016-08-11 ENCOUNTER — Telehealth: Payer: Self-pay

## 2016-08-11 NOTE — Telephone Encounter (Signed)
Left VM he can wait till his CPE in OCT. Per [employer]

## 2016-08-11 NOTE — Telephone Encounter (Signed)
He can wait till his physical

## 2016-08-11 NOTE — Telephone Encounter (Signed)
Pt states that he got an automated call that he is due for the cologuard/colonoscopy. He wants to know if he should do this now or wait until his physical in Oct? CB # [phone].

## 2016-09-24 LAB — HEMOGLOBIN A1C: Hemoglobin A1C: 7 % — AB (ref 4.0–5.6)

## 2016-12-16 HISTORY — PX: OTHER SURGICAL HISTORY: SHX169

## 2016-12-16 LAB — HM COLONOSCOPY

## 2016-12-21 DIAGNOSIS — C61 Malignant neoplasm of prostate: Secondary | ICD-10-CM | POA: Diagnosis not present

## 2016-12-28 DIAGNOSIS — R9721 Rising PSA following treatment for malignant neoplasm of prostate: Secondary | ICD-10-CM | POA: Diagnosis not present

## 2016-12-28 DIAGNOSIS — N393 Stress incontinence (female) (male): Secondary | ICD-10-CM | POA: Diagnosis not present

## 2016-12-28 DIAGNOSIS — C61 Malignant neoplasm of prostate: Secondary | ICD-10-CM | POA: Diagnosis not present

## 2017-02-16 ENCOUNTER — Ambulatory Visit (INDEPENDENT_AMBULATORY_CARE_PROVIDER_SITE_OTHER): Payer: Medicare HMO | Admitting: Family Medicine

## 2017-02-16 ENCOUNTER — Encounter: Payer: Self-pay | Admitting: Family Medicine

## 2017-02-16 VITALS — BP 130/68 | HR 76 | Ht 76.0 in | Wt 267.4 lb

## 2017-02-16 DIAGNOSIS — E1159 Type 2 diabetes mellitus with other circulatory complications: Secondary | ICD-10-CM

## 2017-02-16 DIAGNOSIS — E118 Type 2 diabetes mellitus with unspecified complications: Secondary | ICD-10-CM

## 2017-02-16 DIAGNOSIS — Z23 Encounter for immunization: Secondary | ICD-10-CM

## 2017-02-16 DIAGNOSIS — E1169 Type 2 diabetes mellitus with other specified complication: Secondary | ICD-10-CM

## 2017-02-16 DIAGNOSIS — K5790 Diverticulosis of intestine, part unspecified, without perforation or abscess without bleeding: Secondary | ICD-10-CM | POA: Diagnosis not present

## 2017-02-16 DIAGNOSIS — Z8546 Personal history of malignant neoplasm of prostate: Secondary | ICD-10-CM | POA: Diagnosis not present

## 2017-02-16 DIAGNOSIS — I10 Essential (primary) hypertension: Secondary | ICD-10-CM

## 2017-02-16 DIAGNOSIS — G6289 Other specified polyneuropathies: Secondary | ICD-10-CM

## 2017-02-16 DIAGNOSIS — E785 Hyperlipidemia, unspecified: Secondary | ICD-10-CM | POA: Diagnosis not present

## 2017-02-16 DIAGNOSIS — Z86010 Personal history of colonic polyps: Secondary | ICD-10-CM | POA: Diagnosis not present

## 2017-02-16 DIAGNOSIS — I152 Hypertension secondary to endocrine disorders: Secondary | ICD-10-CM

## 2017-02-16 DIAGNOSIS — J301 Allergic rhinitis due to pollen: Secondary | ICD-10-CM

## 2017-02-16 DIAGNOSIS — Z860101 Personal history of adenomatous and serrated colon polyps: Secondary | ICD-10-CM

## 2017-02-16 LAB — POCT GLYCOSYLATED HEMOGLOBIN (HGB A1C): Hemoglobin A1C: 7.4

## 2017-02-16 NOTE — Progress Notes (Signed)
[name] is a 71 y.o. male who presents for annual wellness visit and follow-up on chronic medical conditions.  He has the following concerns: He gets his care mainly through the [place]. He recently had a colonoscopy which showed adenomatous colonic polyp. He also has diverticulosis. He sees the eye doctors for his glaucoma regularly and has an appointment set up. He is followed by Dr.Wrenn is underlying prostate cancer. His PSA has unfortunately elevated. He is already had a radical prostatectomy as well as radiation. He does have underlying allergic rhinitis and presently is on Claritin but still having difficulty with congestion. Continues on his Pravachol for cholesterol and having no difficulty with them. He is taking metformin and glipizide for his diabetes. He does check his blood sugar usually weekly. He exercises walking 4 miles every day. Does not smoke. He is taking losartan for his blood pressure. He is also being treated at the [facility] for a neuropathy and getting good results with gabapentin. He states that this is apparently secondary to low back pain and herniated disc. He does have a history of hepatitis C however his viral load is undetectable Immunizations and Health Maintenance Immunization History  Administered Date(s) Administered  . Influenza Split 02/04/2011, 02/04/2012  . Influenza Whole 02/18/2009, 02/03/2010  . Influenza, High Dose Seasonal PF 02/06/2014, 02/12/2015, 02/13/2016, 02/16/2017  . Influenza,inj,Quad PF,6+ Mos 02/06/2013  . Pneumococcal Conjugate-13 02/06/2014  . Pneumococcal Polysaccharide-23 02/04/2012  . Tdap 02/03/2010  . Zoster 03/25/2006   Health Maintenance Due  Topic Date Due  . FOOT EXAM  12/22/1955  . HEMOGLOBIN A1C  08/12/2016  . OPHTHALMOLOGY EXAM  09/12/2016  . COLONOSCOPY  10/13/2016  . INFLUENZA VACCINE  12/16/2016    [facility]  [facility]  Exercise:yes   Other doctors caring for  patient include:Dr Jeffie Pollock  Advanced Directives: Yes. Copy asked for.    Depression screen:  See questionnaire below.     Depression screen [facility] 2/9 02/16/2017 02/13/2016 02/12/2015 02/06/2013  Decreased Interest 0 0 0 0  Down, Depressed, Hopeless 0 0 0 0  PHQ - 2 Score 0 0 0 0    Fall Screen: See Questionaire below.   Fall Risk  02/16/2017 02/13/2016 02/12/2015 02/06/2013  Falls in the past year? No No No No    ADL screen:  See questionnaire below.  Functional Status Survey:  normal   Review of Systems  Negative except as above   PHYSICAL EXAM:  BP 130/68   Pulse 76   Ht 6\' 4"  (1.93 m)   Wt 267 lb 6.4 oz (121.3 kg)   SpO2 98%   BMI 32.55 kg/m   General Appearance: Alert, cooperative, no distress, appears stated age Head: Normocephalic, without obvious abnormality, atraumatic Eyes: PERRL, conjunctiva/corneas clear, EOM's intact, fundi benign Ears: Normal TM's and external ear canals Nose: Nares normal, mucosa normal, no drainage or sinus   tenderness Throat: Lips, mucosa, and tongue normal; teeth and gums normal Neck: Supple, no lymphadenopathy, thyroid:no enlargement/tenderness/nodules; no carotid bruit or JVD Lungs: Clear to auscultation bilaterally without wheezes, rales or ronchi; respirations unlabored Heart: Regular rate and rhythm, S1 and S2 normal, no murmur, rub or gallop Abdomen: Soft, non-tender, nondistended, normoactive bowel sounds, no masses, no hepatosplenomegaly Extremities: No clubbing, cyanosis or edema Pulses: 2+ and symmetric all extremities Skin: Skin color, texture, turgor normal, no rashes or lesions Lymph nodes: Cervical, supraclavicular, and axillary nodes normal Neurologic: CNII-XII intact, normal strength, sensation and gait; reflexes  2+ and symmetric throughout   Psych: Normal mood, affect, hygiene and grooming A1c is 7.4 ASSESSMENT/PLAN: Hx of adenomatous colonic polyps  Hypertension associated with diabetes ([place])  Need for influenza  vaccination - Plan: Flu vaccine HIGH DOSE PF (Fluzone High dose)  Type 2 diabetes mellitus with complication, without long-term current use of insulin ([place]) - Plan: HgB A1c  Diverticulosis of intestine without bleeding, unspecified intestinal tract location  Allergic rhinitis due to pollen, unspecified seasonality  History of prostate cancer  Hyperlipidemia associated with type 2 diabetes mellitus ([place])  Other polyneuropathy Since he is getting his care mainly at the [place], I will be available to him for any problems that he has pretty is comfortable with this. Recommend he follow up with him concerning his diabetes as well as still having difficulty with chest congestion Flu shot given.  Medicare Attestation I have personally reviewed: The patient's medical and social history Their use of alcohol, tobacco or illicit drugs Their current medications and supplements The patient's functional ability including ADLs,fall risks, home safety risks, cognitive, and hearing and visual impairment Diet and physical activities Evidence for depression or mood disorders  The patient's weight, height, and BMI have been recorded in the chart.  I have made referrals, counseling, and provided education to the patient based on review of the above and I have provided the patient with a written personalized care plan for preventive services.     Wyatt Haste, MD   02/16/2017

## 2017-02-16 NOTE — Patient Instructions (Signed)
  [name] , Thank you for taking time to come for your Medicare Wellness Visit. I appreciate your ongoing commitment to your health goals. Please review the following plan we discussed and let me know if I can assist you in the future.   These are the goals we discussed: Goals    None      This is a list of the screening recommended for you and due dates:  Health Maintenance  Topic Date Due  . Complete foot exam   12/22/1955  . Hemoglobin A1C  08/12/2016  . Eye exam for diabetics  09/12/2016  . Colon Cancer Screening  10/13/2016  . Flu Shot  12/16/2016  . Tetanus Vaccine  02/04/2020  .  Hepatitis C: One time screening is recommended by Center for Disease Control  (CDC) for  adults born from [birth year] through 1966.   Completed  . Pneumonia vaccines  Completed

## 2017-04-16 LAB — HEMOGLOBIN A1C: HEMOGLOBIN A1C: 7.5 % — AB (ref 4.0–5.6)

## 2017-06-14 DIAGNOSIS — C61 Malignant neoplasm of prostate: Secondary | ICD-10-CM | POA: Diagnosis not present

## 2017-06-18 DIAGNOSIS — R9721 Rising PSA following treatment for malignant neoplasm of prostate: Secondary | ICD-10-CM | POA: Diagnosis not present

## 2017-06-18 DIAGNOSIS — N393 Stress incontinence (female) (male): Secondary | ICD-10-CM | POA: Diagnosis not present

## 2017-06-18 DIAGNOSIS — C61 Malignant neoplasm of prostate: Secondary | ICD-10-CM | POA: Diagnosis not present

## 2017-10-15 LAB — HEMOGLOBIN A1C
HEMOGLOBIN A1C: 7.4 % — AB (ref 4.0–5.6)
Hemoglobin A1C: 7.4 % — AB (ref 4.0–5.6)

## 2017-12-15 DIAGNOSIS — C61 Malignant neoplasm of prostate: Secondary | ICD-10-CM | POA: Diagnosis not present

## 2017-12-22 DIAGNOSIS — R9721 Rising PSA following treatment for malignant neoplasm of prostate: Secondary | ICD-10-CM | POA: Diagnosis not present

## 2017-12-22 DIAGNOSIS — C61 Malignant neoplasm of prostate: Secondary | ICD-10-CM | POA: Diagnosis not present

## 2017-12-22 DIAGNOSIS — N393 Stress incontinence (female) (male): Secondary | ICD-10-CM | POA: Diagnosis not present

## 2018-02-18 ENCOUNTER — Encounter: Payer: Self-pay | Admitting: Family Medicine

## 2018-02-18 ENCOUNTER — Ambulatory Visit (INDEPENDENT_AMBULATORY_CARE_PROVIDER_SITE_OTHER): Payer: Medicare HMO | Admitting: Family Medicine

## 2018-02-18 VITALS — BP 134/86 | HR 68 | Temp 97.4°F | Ht 76.0 in | Wt 271.4 lb

## 2018-02-18 DIAGNOSIS — E1159 Type 2 diabetes mellitus with other circulatory complications: Secondary | ICD-10-CM

## 2018-02-18 DIAGNOSIS — D126 Benign neoplasm of colon, unspecified: Secondary | ICD-10-CM | POA: Diagnosis not present

## 2018-02-18 DIAGNOSIS — M1990 Unspecified osteoarthritis, unspecified site: Secondary | ICD-10-CM | POA: Diagnosis not present

## 2018-02-18 DIAGNOSIS — Z0000 Encounter for general adult medical examination without abnormal findings: Secondary | ICD-10-CM | POA: Diagnosis not present

## 2018-02-18 DIAGNOSIS — B182 Chronic viral hepatitis C: Secondary | ICD-10-CM

## 2018-02-18 DIAGNOSIS — E118 Type 2 diabetes mellitus with unspecified complications: Secondary | ICD-10-CM | POA: Diagnosis not present

## 2018-02-18 DIAGNOSIS — H42 Glaucoma in diseases classified elsewhere: Secondary | ICD-10-CM

## 2018-02-18 DIAGNOSIS — E1140 Type 2 diabetes mellitus with diabetic neuropathy, unspecified: Secondary | ICD-10-CM | POA: Diagnosis not present

## 2018-02-18 DIAGNOSIS — E1169 Type 2 diabetes mellitus with other specified complication: Secondary | ICD-10-CM | POA: Diagnosis not present

## 2018-02-18 DIAGNOSIS — E1139 Type 2 diabetes mellitus with other diabetic ophthalmic complication: Secondary | ICD-10-CM

## 2018-02-18 DIAGNOSIS — Z8546 Personal history of malignant neoplasm of prostate: Secondary | ICD-10-CM

## 2018-02-18 DIAGNOSIS — E785 Hyperlipidemia, unspecified: Secondary | ICD-10-CM

## 2018-02-18 DIAGNOSIS — J301 Allergic rhinitis due to pollen: Secondary | ICD-10-CM | POA: Diagnosis not present

## 2018-02-18 DIAGNOSIS — I10 Essential (primary) hypertension: Secondary | ICD-10-CM

## 2018-02-18 LAB — POCT GLYCOSYLATED HEMOGLOBIN (HGB A1C): Hemoglobin A1C: 7.1 % — AB (ref 4.0–5.6)

## 2018-02-18 NOTE — Progress Notes (Signed)
[name] is a 72 y.o. male who presents for annual wellness visit and follow-up on chronic medical conditions.  He has no particular concerns or complaints.  He gets all of his care through the [place].  They recently found a colonic polyp.  He is scheduled for follow-up colonoscopy in 5 years.  He does have history of glaucoma and optic nerve issue that I am unclear of what it is.  They are also taking care of his diabetes.  He continues on his blood pressure medications as well as a statin and his diabetes meds.  He has hepatitis C but viral load undetectable.  He is on gabapentin for neuropathy.  He does see Dr. Jeffie Pollock for his prostate cancer.  His allergies are under good control.  He does have arthritis especially in his back and does use codeine once or twice per day.  Again all these are take care of at the [place].  He exercises regularly, does not smoke or drink.  He is enjoying his retirement.  Immunizations and Health Maintenance Immunization History  Administered Date(s) Administered  . Influenza Split 02/04/2011, 02/04/2012  . Influenza Whole 02/18/2009, 02/03/2010  . Influenza, High Dose Seasonal PF 02/06/2014, 02/12/2015, 02/13/2016, 02/16/2017  . Influenza,inj,Quad PF,6+ Mos 02/06/2013  . Pneumococcal Conjugate-13 02/06/2014  . Pneumococcal Polysaccharide-23 02/04/2012  . Tdap 02/03/2010  . Zoster 03/25/2006  . Zoster Recombinat (Shingrix) 03/25/2017   Health Maintenance Due  Topic Date Due  . FOOT EXAM  12/22/1955  . OPHTHALMOLOGY EXAM  09/12/2016  . COLONOSCOPY  10/13/2016  . HEMOGLOBIN A1C  08/17/2017  . INFLUENZA VACCINE  12/16/2017    Last colonoscopy: one year ago Last PSA: 12/2017 10. ? Per pt  Dentist:four years ago Ophtho:two ago Exercise: running and weights   [facility] doctors [facility] for patient include:[facility]  Advanced Directives: No.  Information given.    Depression screen:  See questionnaire below.     Depression screen [facility] 2/9 02/18/2018 02/16/2017  02/13/2016 02/12/2015 02/06/2013  Decreased Interest 0 0 0 0 0  Down, Depressed, Hopeless 0 0 0 0 0  PHQ - 2 Score 0 0 0 0 0    Fall Screen: See Questionaire below.   Fall Risk  02/18/2018 02/16/2017 02/13/2016 02/12/2015 02/06/2013  Falls in the past year? - No No No No  Number falls in past yr: 2 or more - - - -  Injury with Fall? No - - - -  Risk for fall due to : Impaired vision - - - -    ADL screen:  See questionnaire below.  Functional Status Survey: Is the patient deaf or have difficulty hearing?: No Does the patient have difficulty seeing, even when wearing glasses/contacts?: Yes Does the patient have difficulty concentrating, remembering, or making decisions?: No Does the patient have difficulty walking or climbing stairs?: No Does the patient have difficulty dressing or bathing?: No Does the patient have difficulty doing errands alone such as visiting a doctor's office or shopping?: No   Review of Systems  Constitutional: -, -unexpected weight change, -anorexia, -fatigue  Dermatology: denies changing moles, rash, lumps ENT: -runny nose, -ear pain, -sore throat,  Cardiology:  -chest pain, -palpitations, -orthopnea, Respiratory: -cough, -shortness of breath, -dyspnea on exertion, -wheezing,  Gastroenterology: -abdominal pain, -nausea, -vomiting, -diarrhea, -constipation, -dysphagia Hematology: -bleeding or bruising problems Musculoskeletal: , -myalgias, -joint swelling,, -  Urology: -dysuria, -difficulty urinating,  -urinary frequency, -urgency, incontinence Neurology: -, -numbness, , -memory loss, -falls, -dizziness    PHYSICAL EXAM:  There were no vitals taken for this visit.  General Appearance: Alert, cooperative, no distress, appears stated age Head: Normocephalic, without obvious abnormality, atraumatic Eyes: PERRL, conjunctiva/corneas clear, EOM's intact, fundi benign Ears: Normal TM's and external ear canals Nose: Nares normal, mucosa normal, no drainage or  sinus   tenderness Throat: Lips, mucosa, and tongue normal; teeth and gums normal Neck: Supple, no lymphadenopathy, thyroid:no enlargement/tenderness/nodules; no carotid bruit or JVD Lungs: Clear to auscultation bilaterally without wheezes, rales or ronchi; respirations unlabored Heart: Regular rate and rhythm, S1 and S2 normal, no murmur, rub or gallop Abdomen: Soft, non-tender, nondistended, normoactive bowel sounds, no masses, no hepatosplenomegaly Extremities: No clubbing, cyanosis or edema Pulses: 2+ and symmetric all extremities Skin: Skin color, texture, turgor normal, no rashes or lesions Lymph nodes: Cervical, supraclavicular, and axillary nodes normal Neurologic: CNII-XII intact, normal strength, sensation and gait; reflexes 2+ and symmetric throughout   Psych: Normal mood, affect, hygiene and grooming  ASSESSMENT/PLAN: Routine general medical examination at a health care facility  History of prostate cancer  Arthritis  Diabetes mellitus type 2 with complications (HCC)  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  Colonoscopy recommendations reviewed.   Medicare Attestation I have personally reviewed: The patient's medical and social history Their use of alcohol, tobacco or illicit drugs Their current medications and supplements The patient's functional ability including ADLs,fall risks, home safety risks, cognitive, and hearing and visual impairment Diet and physical activities Evidence for depression or mood disorders  The  patient's weight, height, and BMI have been recorded in the chart.  I have made referrals, counseling, and provided education to the patient based on review of the above and I have provided the patient with a written personalized care plan for preventive services.     [name], MD   02/18/2018

## 2018-02-18 NOTE — Addendum Note (Signed)
Addended by: Elyse Jarvis on: 02/18/2018 10:22 AM   Modules accepted: Orders

## 2018-02-23 ENCOUNTER — Telehealth: Payer: Self-pay | Admitting: Family Medicine

## 2018-02-23 NOTE — Telephone Encounter (Signed)
Spoke to pt advising him that we are trying to get records from the [place]. Unable to reach anyone at this time . Will call pt back to see if he can come by and sign a release form so this can be taken care of. [name]

## 2018-02-23 NOTE — Telephone Encounter (Signed)
Pt states he keeps getting calls about needing colonoscopy, said had it done at [facility] 2017 in [place] Dr. Dell Ponto is primary doctor

## 2018-03-18 ENCOUNTER — Telehealth: Payer: Self-pay | Admitting: Family Medicine

## 2018-03-18 NOTE — Telephone Encounter (Signed)
Pt dropped of paper work from the [place] from when he had a colonoscopy. Put in your folder for review

## 2018-04-06 LAB — HM DIABETES EYE EXAM

## 2018-04-11 ENCOUNTER — Telehealth: Payer: Self-pay | Admitting: Family Medicine

## 2018-04-11 NOTE — Telephone Encounter (Signed)
Records received from the [place]. Sending most recent back and 2 paper paper charts made to hold the rest of records.

## 2018-04-12 DIAGNOSIS — H269 Unspecified cataract: Secondary | ICD-10-CM | POA: Insufficient documentation

## 2018-04-12 DIAGNOSIS — Z9849 Cataract extraction status, unspecified eye: Secondary | ICD-10-CM | POA: Insufficient documentation

## 2018-04-12 DIAGNOSIS — D369 Benign neoplasm, unspecified site: Secondary | ICD-10-CM | POA: Insufficient documentation

## 2018-05-10 ENCOUNTER — Encounter: Payer: Self-pay | Admitting: Family Medicine

## 2018-06-08 ENCOUNTER — Encounter: Payer: Self-pay | Admitting: Family Medicine

## 2018-06-15 DIAGNOSIS — C61 Malignant neoplasm of prostate: Secondary | ICD-10-CM | POA: Diagnosis not present

## 2018-06-22 DIAGNOSIS — R9721 Rising PSA following treatment for malignant neoplasm of prostate: Secondary | ICD-10-CM | POA: Diagnosis not present

## 2018-06-22 DIAGNOSIS — N393 Stress incontinence (female) (male): Secondary | ICD-10-CM | POA: Diagnosis not present

## 2018-06-22 DIAGNOSIS — C61 Malignant neoplasm of prostate: Secondary | ICD-10-CM | POA: Diagnosis not present

## 2018-12-20 DIAGNOSIS — C61 Malignant neoplasm of prostate: Secondary | ICD-10-CM | POA: Diagnosis not present

## 2018-12-26 DIAGNOSIS — N393 Stress incontinence (female) (male): Secondary | ICD-10-CM | POA: Diagnosis not present

## 2018-12-26 DIAGNOSIS — R9721 Rising PSA following treatment for malignant neoplasm of prostate: Secondary | ICD-10-CM | POA: Diagnosis not present

## 2018-12-26 DIAGNOSIS — C61 Malignant neoplasm of prostate: Secondary | ICD-10-CM | POA: Diagnosis not present

## 2019-02-21 ENCOUNTER — Other Ambulatory Visit: Payer: Self-pay

## 2019-02-21 ENCOUNTER — Encounter: Payer: Self-pay | Admitting: Family Medicine

## 2019-02-21 ENCOUNTER — Ambulatory Visit (INDEPENDENT_AMBULATORY_CARE_PROVIDER_SITE_OTHER): Payer: Medicare HMO | Admitting: Family Medicine

## 2019-02-21 VITALS — BP 110/64 | HR 68 | Temp 98.0°F | Ht 76.0 in | Wt 272.6 lb

## 2019-02-21 DIAGNOSIS — M1990 Unspecified osteoarthritis, unspecified site: Secondary | ICD-10-CM | POA: Diagnosis not present

## 2019-02-21 DIAGNOSIS — E1159 Type 2 diabetes mellitus with other circulatory complications: Secondary | ICD-10-CM | POA: Diagnosis not present

## 2019-02-21 DIAGNOSIS — Z8546 Personal history of malignant neoplasm of prostate: Secondary | ICD-10-CM

## 2019-02-21 DIAGNOSIS — E118 Type 2 diabetes mellitus with unspecified complications: Secondary | ICD-10-CM | POA: Diagnosis not present

## 2019-02-21 DIAGNOSIS — J301 Allergic rhinitis due to pollen: Secondary | ICD-10-CM

## 2019-02-21 DIAGNOSIS — E785 Hyperlipidemia, unspecified: Secondary | ICD-10-CM

## 2019-02-21 DIAGNOSIS — E1139 Type 2 diabetes mellitus with other diabetic ophthalmic complication: Secondary | ICD-10-CM | POA: Diagnosis not present

## 2019-02-21 DIAGNOSIS — E1169 Type 2 diabetes mellitus with other specified complication: Secondary | ICD-10-CM | POA: Diagnosis not present

## 2019-02-21 DIAGNOSIS — H269 Unspecified cataract: Secondary | ICD-10-CM | POA: Diagnosis not present

## 2019-02-21 DIAGNOSIS — Z0000 Encounter for general adult medical examination without abnormal findings: Secondary | ICD-10-CM

## 2019-02-21 DIAGNOSIS — D126 Benign neoplasm of colon, unspecified: Secondary | ICD-10-CM | POA: Diagnosis not present

## 2019-02-21 DIAGNOSIS — Z8619 Personal history of other infectious and parasitic diseases: Secondary | ICD-10-CM

## 2019-02-21 DIAGNOSIS — H42 Glaucoma in diseases classified elsewhere: Secondary | ICD-10-CM

## 2019-02-21 DIAGNOSIS — I10 Essential (primary) hypertension: Secondary | ICD-10-CM

## 2019-02-21 LAB — POCT GLYCOSYLATED HEMOGLOBIN (HGB A1C): Hemoglobin A1C: 7.3 % — AB (ref 4.0–5.6)

## 2019-02-21 NOTE — Patient Instructions (Signed)
  [name] , Thank you for taking time to come for your Medicare Wellness Visit. I appreciate your ongoing commitment to your health goals. Please review the following plan we discussed and let me know if I can assist you in the future.   These are the goals we discussed: Continue to be followed at the [facility] and by Dr. Jeffie Pollock. This is a list of the screening recommended for you and due dates:  Health Maintenance  Topic Date Due  . Complete foot exam   12/22/1955  . Hemoglobin A1C  08/20/2018  . Flu Shot  12/17/2018  . Eye exam for diabetics  04/07/2019  . Tetanus Vaccine  02/04/2020  . Colon Cancer Screening  12/17/2026  .  Hepatitis C: One time screening is recommended by Center for Disease Control  (CDC) for  adults born from [birth year] through 1966.   Completed  . Pneumonia vaccines  Completed

## 2019-02-21 NOTE — Progress Notes (Signed)
[name] is a 73 y.o. male who presents for annual wellness,CPE visit and follow-up on chronic medical conditions.  He is cared for at the [facility] for most of his medical concerns but does see urology locally for his underlying history of prostate cancer.  His last PSA was apparently 14.  He plans to see Dr. Jeffie Pollock in the near future.  He also has a history of colonic polyp and is scheduled for follow-up colonoscopy next year.  He is taking glipizide as well as metformin for his diabetes and having no difficulty with that.  Continues on losartan for his blood pressure.  Also taking pravastatin and having no aches or pains with that.  He does have underlying allergies and they seem to be pretty well controlled.  He does have a history of glaucoma and cataract surgery and does follow-up regularly with ophthalmology.  Does complain of arthritis type symptoms but does not need any medications. He has a previous history of hepatitis C and has been treated.  His last viral load was undetectable.  Family and social history was reviewed.  He is now retired and enjoying it.  Immunizations and Health Maintenance Immunization History  Administered Date(s) Administered  . Influenza Split 02/04/2011, 02/04/2012  . Influenza Whole 02/18/2009, 02/03/2010  . Influenza, High Dose Seasonal PF 02/06/2014, 02/12/2015, 02/13/2016, 02/16/2017  . Influenza,inj,Quad PF,6+ Mos 02/06/2013  . Influenza-Unspecified 02/14/2018  . Pneumococcal Conjugate-13 02/06/2014  . Pneumococcal Polysaccharide-23 02/04/2012  . Tdap 02/03/2010  . Zoster 03/25/2006  . Zoster Recombinat (Shingrix) 03/25/2017, 06/02/2017   Health Maintenance Due  Topic Date Due  . FOOT EXAM  12/22/1955  . HEMOGLOBIN A1C  08/20/2018  . INFLUENZA VACCINE  12/17/2018    Last colonoscopy: 2018 Last PSA: Dr. Jeffie Pollock-  Dentist: dentures Ophtho: 10/20 Exercise: Mainly work around the house  Other doctors caring for patient include: [facility]   Advanced Directives:Yes. Copy asked for    Depression screen:  See questionnaire below.     Depression screen [facility] 2/9 02/21/2019 02/18/2018 02/16/2017 02/13/2016 02/12/2015  Decreased Interest 0 0 0 0 0  Down, Depressed, Hopeless 0 0 0 0 0  PHQ - 2 Score 0 0 0 0 0    Fall Screen: See Questionaire below.   Fall Risk  02/21/2019 02/18/2018 02/16/2017 02/13/2016 02/12/2015  Falls in the past year? 0 - No No No  Number falls in past yr: 0 2 or more - - -  Injury with Fall? 0 No - - -  Risk for fall due to : - Impaired vision - - -    ADL screen:  See questionnaire below.  Functional Status Survey: Is the patient deaf or have difficulty hearing?: No Does the patient have difficulty seeing, even when wearing glasses/contacts?: Yes Does the patient have difficulty concentrating, remembering, or making decisions?: No Does the patient have difficulty walking or climbing stairs?: No Does the patient have difficulty dressing or bathing?: No Does the patient have difficulty doing errands alone such as visiting a doctor's office or shopping?: No   Review of Systems  Constitutional: -, -unexpected weight change, -anorexia, -fatigue Allergy: -sneezing, -itching, -congestion Dermatology: denies changing moles, rash, lumps ENT: -runny nose, -ear pain, -sore throat,  Cardiology:  -chest pain, -palpitations, -orthopnea, Respiratory: -cough, -shortness of breath, -dyspnea on exertion, -wheezing,  Gastroenterology: -abdominal pain, -nausea, -vomiting, -diarrhea, -constipation, -dysphagia Hematology: -bleeding or bruising problems Musculoskeletal: -arthralgias, -myalgias, -joint swelling, -back pain, - Ophthalmology: -vision changes,  Urology: -dysuria, -  difficulty urinating,  -urinary frequency, -urgency, incontinence Neurology: -, -numbness, , -memory loss, -falls, -dizziness    PHYSICAL EXAM:  General Appearance: Alert, cooperative, no distress, appears stated  age Head: Normocephalic, without obvious abnormality, atraumatic Eyes: PERRL, conjunctiva/corneas clear, EOM's intact, fundi benign Ears: Normal TM's and external ear canals Nose: Nares normal, mucosa normal, no drainage or sinus   tenderness Throat: Lips, mucosa, and tongue normal; teeth and gums normal Neck: Supple, no lymphadenopathy, thyroid:no enlargement/tenderness/nodules; no carotid bruit or JVD Lungs: Clear to auscultation bilaterally without wheezes, rales or ronchi; respirations unlabored Heart: Regular rate and rhythm, S1 and S2 normal, no murmur, rub or gallop Abdomen: Soft, non-tender, nondistended, normoactive bowel sounds, no masses, no hepatosplenomegaly Extremities: No clubbing, cyanosis or edema Pulses: 2+ and symmetric all extremities Skin: Skin color, texture, turgor normal, no rashes or lesions Lymph nodes: Cervical, supraclavicular, and axillary nodes normal Neurologic: CNII-XII intact, normal strength, sensation and gait; reflexes 2+ and symmetric throughout   Psych: Normal mood, affect, hygiene and grooming Hemoglobin A1c is 7.3 ASSESSMENT/PLAN: Encounter Diagnoses  Name Primary?  . Diabetes mellitus type 2 with complications ([place]) Yes  . Hypertension associated with diabetes ([place])   . Hyperlipidemia associated with type 2 diabetes mellitus ([place])   . History of prostate cancer   . Adenomatous polyp of colon, unspecified part of colon   . 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 Cataract of right eye, unspecified cataract type   . Allergic rhinitis due to pollen, unspecified seasonality   . Arthritis   . History of hepatitis C   Since he is getting most of his care through the [place].  No intervention at this point is needed here.  Immunization recommendations discussed.  Colonoscopy recommendations reviewed.   Medicare Attestation I have personally reviewed: The patient's medical and social history Their use of alcohol, tobacco or illicit  drugs Their current medications and supplements The patient's functional ability including ADLs,fall risks, home safety risks, cognitive, and hearing and visual impairment Diet and physical activities Evidence for depression or mood disorders  The patient's weight, height, and BMI have been recorded in the chart.  I have made referrals, counseling, and provided education to the patient based on review of the above and I have provided the patient with a written personalized care plan for preventive services.     [name], MD   02/21/2019

## 2019-04-04 DIAGNOSIS — C61 Malignant neoplasm of prostate: Secondary | ICD-10-CM | POA: Diagnosis not present

## 2019-04-05 ENCOUNTER — Other Ambulatory Visit: Payer: Self-pay | Admitting: Urology

## 2019-04-05 ENCOUNTER — Other Ambulatory Visit (HOSPITAL_COMMUNITY): Payer: Self-pay | Admitting: Urology

## 2019-04-05 DIAGNOSIS — C61 Malignant neoplasm of prostate: Secondary | ICD-10-CM

## 2019-04-28 ENCOUNTER — Other Ambulatory Visit: Payer: Self-pay

## 2019-04-28 ENCOUNTER — Encounter (HOSPITAL_COMMUNITY)
Admission: RE | Admit: 2019-04-28 | Discharge: 2019-04-28 | Disposition: A | Discharge status: Home / Self Care | Payer: Medicare HMO | Source: Ambulatory Visit | Attending: Urology | Admitting: Urology

## 2019-04-28 DIAGNOSIS — C61 Malignant neoplasm of prostate: Secondary | ICD-10-CM

## 2019-04-28 DIAGNOSIS — M545 Low back pain: Secondary | ICD-10-CM | POA: Diagnosis not present

## 2019-04-28 MED ORDER — TECHNETIUM TC 99M MEDRONATE IV KIT
21.0000 | PACK | Freq: Once | INTRAVENOUS | Status: AC
  Administered 2019-04-28: 21 via INTRAVENOUS

## 2019-05-10 ENCOUNTER — Other Ambulatory Visit (HOSPITAL_COMMUNITY): Payer: Self-pay | Admitting: Urology

## 2019-05-10 ENCOUNTER — Other Ambulatory Visit: Payer: Self-pay

## 2019-05-10 ENCOUNTER — Ambulatory Visit (HOSPITAL_COMMUNITY)
Admission: RE | Admit: 2019-05-10 | Discharge: 2019-05-10 | Disposition: A | Discharge status: Home / Self Care | Payer: Medicare HMO | Source: Ambulatory Visit | Attending: Urology | Admitting: Urology

## 2019-05-10 DIAGNOSIS — C61 Malignant neoplasm of prostate: Secondary | ICD-10-CM | POA: Insufficient documentation

## 2019-05-29 DIAGNOSIS — H04123 Dry eye syndrome of bilateral lacrimal glands: Secondary | ICD-10-CM | POA: Diagnosis not present

## 2019-05-29 DIAGNOSIS — H4043X3 Glaucoma secondary to eye inflammation, bilateral, severe stage: Secondary | ICD-10-CM | POA: Diagnosis not present

## 2019-05-29 DIAGNOSIS — H401134 Primary open-angle glaucoma, bilateral, indeterminate stage: Secondary | ICD-10-CM | POA: Diagnosis not present

## 2019-05-29 DIAGNOSIS — H209 Unspecified iridocyclitis: Secondary | ICD-10-CM | POA: Diagnosis not present

## 2019-05-29 DIAGNOSIS — Z961 Presence of intraocular lens: Secondary | ICD-10-CM | POA: Diagnosis not present

## 2019-06-27 DIAGNOSIS — H401113 Primary open-angle glaucoma, right eye, severe stage: Secondary | ICD-10-CM | POA: Insufficient documentation

## 2019-06-27 DIAGNOSIS — H3581 Retinal edema: Secondary | ICD-10-CM | POA: Insufficient documentation

## 2019-06-27 DIAGNOSIS — T8521XA Breakdown (mechanical) of intraocular lens, initial encounter: Secondary | ICD-10-CM | POA: Insufficient documentation

## 2019-06-27 DIAGNOSIS — H30031 Focal chorioretinal inflammation, peripheral, right eye: Secondary | ICD-10-CM | POA: Insufficient documentation

## 2019-07-07 DIAGNOSIS — Z7689 Persons encountering health services in other specified circumstances: Secondary | ICD-10-CM | POA: Insufficient documentation

## 2019-08-11 DIAGNOSIS — C61 Malignant neoplasm of prostate: Secondary | ICD-10-CM | POA: Diagnosis not present

## 2019-08-16 ENCOUNTER — Other Ambulatory Visit (HOSPITAL_COMMUNITY): Payer: Self-pay | Admitting: Urology

## 2019-08-16 DIAGNOSIS — C61 Malignant neoplasm of prostate: Secondary | ICD-10-CM | POA: Diagnosis not present

## 2019-08-16 DIAGNOSIS — N393 Stress incontinence (female) (male): Secondary | ICD-10-CM | POA: Diagnosis not present

## 2019-08-16 DIAGNOSIS — R8271 Bacteriuria: Secondary | ICD-10-CM | POA: Diagnosis not present

## 2019-08-16 DIAGNOSIS — R9721 Rising PSA following treatment for malignant neoplasm of prostate: Secondary | ICD-10-CM

## 2019-08-24 ENCOUNTER — Ambulatory Visit (HOSPITAL_COMMUNITY)
Admission: RE | Admit: 2019-08-24 | Discharge: 2019-08-24 | Disposition: A | Discharge status: Home / Self Care | Payer: Medicare HMO | Source: Ambulatory Visit | Attending: Urology | Admitting: Urology

## 2019-08-24 ENCOUNTER — Other Ambulatory Visit: Payer: Self-pay

## 2019-08-24 DIAGNOSIS — R9721 Rising PSA following treatment for malignant neoplasm of prostate: Secondary | ICD-10-CM | POA: Insufficient documentation

## 2019-08-24 DIAGNOSIS — C61 Malignant neoplasm of prostate: Secondary | ICD-10-CM | POA: Diagnosis not present

## 2019-08-24 MED ORDER — AXUMIN (FLUCICLOVINE F 18) INJECTION
10.1000 | Freq: Once | INTRAVENOUS | Status: AC | PRN
  Administered 2019-08-24: 14:00:00 10.1 via INTRAVENOUS

## 2019-09-27 ENCOUNTER — Other Ambulatory Visit: Payer: Self-pay

## 2019-09-27 ENCOUNTER — Encounter (HOSPITAL_COMMUNITY): Payer: Self-pay

## 2019-09-27 ENCOUNTER — Ambulatory Visit (HOSPITAL_COMMUNITY)
Admission: RE | Admit: 2019-09-27 | Discharge: 2019-09-27 | Disposition: A | Discharge status: Home / Self Care | Payer: Medicare HMO | Source: Ambulatory Visit | Attending: Family Medicine | Admitting: Family Medicine

## 2019-09-27 ENCOUNTER — Ambulatory Visit (HOSPITAL_COMMUNITY)
Admission: EM | Admit: 2019-09-27 | Discharge: 2019-09-27 | Disposition: A | Discharge status: Home / Self Care | Payer: Medicare HMO | Attending: Family Medicine | Admitting: Family Medicine

## 2019-09-27 DIAGNOSIS — M79662 Pain in left lower leg: Secondary | ICD-10-CM | POA: Diagnosis not present

## 2019-09-27 DIAGNOSIS — I872 Venous insufficiency (chronic) (peripheral): Secondary | ICD-10-CM | POA: Insufficient documentation

## 2019-09-27 DIAGNOSIS — M7989 Other specified soft tissue disorders: Secondary | ICD-10-CM

## 2019-09-27 DIAGNOSIS — M79609 Pain in unspecified limb: Secondary | ICD-10-CM | POA: Diagnosis not present

## 2019-09-27 DIAGNOSIS — C61 Malignant neoplasm of prostate: Secondary | ICD-10-CM | POA: Diagnosis not present

## 2019-09-27 NOTE — ED Provider Notes (Signed)
[facility]    CSN: [id number] Arrival date & time: 09/27/19  1148      History   Chief Complaint Chief Complaint  Patient presents with  . Foot Swelling    HPI [name] is a 74 y.o. male.   Patient is a 73 year old male past medical history of arthritis, cancer, diabetes, diverticulosis, dyslipidemia, hypertension.  He presents today with right foot swelling.  Reporting started in the right toe spread of into left lower extremity.  The area is generalized erythema. Denies any injury to the foot. Contacted the [facility] and they prescribed steroids for possible gout. He has not started these. Hx of DVT. No numbness, tingling, weakness in the leg.   ROS per HPI      Past Medical History:  Diagnosis Date  . Arthritis   . Cancer ([place])    PROSTATE  . Diabetes mellitus   . Diverticulosis   . Dyslipidemia   . ED (erectile dysfunction)   . Hemorrhoids   . Hypertension     Patient Active Problem List   Diagnosis Date Noted  . Cataract of right eye 04/12/2018  . Adenomatous polyp of colon 02/18/2018  . History of hepatitis C 02/19/2016  . Rhinitis, allergic 02/06/2014  . Diabetes mellitus type 2 with complications ([place]) [id number]  . Hypertension associated with diabetes ([place]) 02/04/2011  . Hyperlipidemia associated with type 2 diabetes mellitus ([place]) 02/04/2011  . History of prostate cancer 02/04/2011  . 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 Arthritis 02/04/2011    Past Surgical History:  Procedure Laterality Date  . COLONOSCOPY  2008   Dr.hung  . PROSTATE SURGERY     PROSTATECTOMY  . tubla adenoma N/A 12/2016       Home Medications    Prior to Admission medications   Medication Sig Start Date End Date Taking? Authorizing Provider  aspirin 81 MG tablet Take 81 mg by mouth daily.   Yes [provider]  calcium-vitamin D (OSCAL WITH D) 500-200 MG-UNIT per tablet Take 1 tablet by mouth daily.     Yes [provider]  fexofenadine (ALLEGRA) 180 MG tablet Take 180 mg by mouth daily.   Yes [provider]  GABAPENTIN PO Take 1 capsule by mouth 2 (two) times daily.   Yes [provider]  glipiZIDE (GLUCOTROL) 5 MG tablet Take 2.5 mg by mouth 2 (two) times daily before a meal.   Yes [provider]  loperamide (IMODIUM) 2 MG capsule Take 2 mg by mouth 4 (four) times daily as needed for diarrhea or loose stools. 1 capsule every 4 hours as needed for diarrhea. Not to exceed 8 tabs/24hr   Yes [provider]  losartan (COZAAR) 100 MG tablet Take 100 mg by mouth daily.   Yes [provider]  metFORMIN (GLUCOPHAGE) 500 MG tablet Take 500 mg by mouth 2 (two) times daily with a meal.     Yes [provider]  pravastatin (PRAVACHOL) 10 MG tablet Take 10 mg by mouth daily.   Yes [provider]  psyllium (METAMUCIL) 58.6 % packet Take 1 packet by mouth daily.     Yes [provider]  zolpidem (AMBIEN CR) 6.25 MG CR tablet Take 10 mg by mouth at bedtime as needed.     Yes [provider]    Family History Family History  Problem Relation Age of Onset  . Diabetes Mother   . Diabetes Sister   .  Diabetes Brother   . Diabetes Maternal Aunt   . Diabetes Maternal Uncle   . Diabetes Maternal Grandmother   . Diabetes Maternal Grandfather     Social History Social History   Tobacco Use  . Smoking status: Former Research scientist (life sciences)  . Smokeless tobacco: Never Used  Substance Use Topics  . Alcohol use: No  . Drug use: No     Allergies   Ace inhibitors   Review of Systems Review of Systems   Physical Exam Triage Vital Signs ED Triage Vitals  Enc Vitals Group     BP 09/27/19 1226 138/84     Pulse Rate 09/27/19 1226 83     Resp 09/27/19 1226 18     Temp 09/27/19 1226 97.9 F (36.6 C)     Temp Source 09/27/19 1226 Oral     SpO2 09/27/19 1226 98 %     Weight 09/27/19 1226 273 lb (123.8 kg)     Height 09/27/19 1226 6\' 4"   (1.93 m)     Head Circumference --      Peak Flow --      Pain Score 09/27/19 1225 9     Pain Loc --      Pain Edu? --      Excl. in [place]? --    No data found.  Updated Vital Signs BP 138/84 (BP Location: Left Arm)   Pulse 83   Temp 97.9 F (36.6 C) (Oral)   Resp 18   Ht 6\' 4"  (1.93 m)   Wt 273 lb (123.8 kg)   SpO2 98%   BMI 33.23 kg/m   Visual Acuity Right Eye Distance:   Left Eye Distance:   Bilateral Distance:    Right Eye Near:   Left Eye Near:    Bilateral Near:     Physical Exam Vitals and nursing note reviewed.  Constitutional:      Appearance: Normal appearance.  HENT:     Head: Normocephalic and atraumatic.     Nose: Nose normal.  Eyes:     Conjunctiva/sclera: Conjunctivae normal.  Pulmonary:     Effort: Pulmonary effort is normal.  Musculoskeletal:        General: Normal range of motion.     Cervical back: Normal range of motion.     Right lower leg: Tenderness present. 2+ Edema present.     Comments: 1+ pulse Normal temperature.  Generalized erythema.   Skin:    General: Skin is warm and dry.  Neurological:     Mental Status: He is alert.  Psychiatric:        Mood and Affect: Mood normal.      UC Treatments / Results  Labs (all labs ordered are listed, but only abnormal results are displayed) Labs Reviewed - No data to display  EKG   Radiology No results found.  Procedures Procedures (including critical care time)  Medications Ordered in UC Medications - No data to display  Initial Impression / Assessment and Plan / UC Course  I have reviewed the triage vital signs and the nursing notes.  Pertinent labs & imaging results that were available during my care of the patient were reviewed by me and considered in my medical decision making (see chart for details).     LLE swelling There is concern for DVT based on hx and exam  Sending for [place] to r/o Other differential include cellulitis, PVD He has appointment at 3:45 to see  oncology.  Follow up as needed for  continued or worsening symptoms  Final Clinical Impressions(s) / UC Diagnoses   Final diagnoses:  Leg swelling     Discharge Instructions     Please go for an ultrasound of the leg to rule out a blood clot.  Follow up with our doctor as planned.  Go to main entrance and check in at the admitting desk.  They will call with the results.     ED Prescriptions    None     PDMP not reviewed this encounter.   Orvan July, NP 09/27/19 1339

## 2019-09-27 NOTE — Progress Notes (Signed)
Lower venous duplex       has been completed. Preliminary results can be found under CV proc through chart review. Analena Gama, BS, RDMS, RVT   

## 2019-09-27 NOTE — Discharge Instructions (Addendum)
Please go for an ultrasound of the leg to rule out a blood clot.  Follow up with our doctor as planned.  Go to main entrance and check in at the admitting desk.  They will call with the results.

## 2019-09-27 NOTE — ED Triage Notes (Signed)
Patient complains of right foot swelling that started originally on Monday and has since started to spread up his leg. Patient reports that area has been swelling, painful and is red.

## 2019-10-17 DIAGNOSIS — C61 Malignant neoplasm of prostate: Secondary | ICD-10-CM | POA: Diagnosis not present

## 2019-10-17 DIAGNOSIS — Z5111 Encounter for antineoplastic chemotherapy: Secondary | ICD-10-CM | POA: Diagnosis not present

## 2019-10-23 ENCOUNTER — Encounter: Payer: Self-pay | Admitting: Radiation Oncology

## 2019-10-23 NOTE — Progress Notes (Signed)
GU Location of Tumor / Histology: metastatic node positive prostate cancer with a rising PSA up to 23.7 from 14.3 six months ago.   Gleason Score is (4 + 4) prostatic adenocarcinoma diagnosed in 2006 shortly followed by a prostatectomy.  Axium PET found uptake in the left common iliac and periaortic nodes.       Past/Anticipated interventions by urology, if any: prostate biopsy, prostatectomy, surveillance, Axium PET, Firmagon, Lupron, referral for salvage radiation therapy  Past/Anticipated interventions by medical oncology, if any: no  Weight changes, if any: no  Bowel/Bladder complaints, if any: Reports ED. Reports urinary incontinence.   Nausea/Vomiting, if any: no  Pain issues, if any:  Yes   SAFETY ISSUES:  Prior radiation? yes, under the care of Dr. Valere Dross in 2008  Pacemaker/ICD? no  Possible current pregnancy? no, male patient  Is the patient on methotrexate? no  Current Complaints / other details:  74 year old male. Married. Resides in [place]. Questions and concerns addressed.

## 2019-10-23 NOTE — Progress Notes (Signed)
See progress note under physician encounter. 

## 2019-10-24 ENCOUNTER — Ambulatory Visit
Admission: RE | Admit: 2019-10-24 | Discharge: 2019-10-24 | Disposition: A | Discharge status: Home / Self Care | Payer: Medicare HMO | Source: Ambulatory Visit | Attending: Radiation Oncology | Admitting: Radiation Oncology

## 2019-10-24 ENCOUNTER — Encounter: Payer: Self-pay | Admitting: Radiation Oncology

## 2019-10-24 DIAGNOSIS — Z8546 Personal history of malignant neoplasm of prostate: Secondary | ICD-10-CM | POA: Diagnosis not present

## 2019-10-24 DIAGNOSIS — C61 Malignant neoplasm of prostate: Secondary | ICD-10-CM

## 2019-10-24 DIAGNOSIS — R9721 Rising PSA following treatment for malignant neoplasm of prostate: Secondary | ICD-10-CM | POA: Diagnosis not present

## 2019-10-24 DIAGNOSIS — Z923 Personal history of irradiation: Secondary | ICD-10-CM | POA: Diagnosis not present

## 2019-10-24 DIAGNOSIS — C775 Secondary and unspecified malignant neoplasm of intrapelvic lymph nodes: Secondary | ICD-10-CM | POA: Diagnosis not present

## 2019-10-24 HISTORY — DX: Malignant neoplasm of prostate: C61

## 2019-10-24 NOTE — Progress Notes (Signed)
Radiation Oncology         [phone] ________________________________  Initial Outpatient Consultation - Conducted via Telephone due to current COVID-19 concerns for limiting patient exposure  Name: [name] MRN: [id number]  Date: 10/24/2019  DOB: [birth year]  [id number], Elyse Jarvis, MD  [name], MD   REFERRING PHYSICIAN: [name], MD  DIAGNOSIS: 74 y.o. gentleman with oligometastatic nodal recurrence of Gleason 4+3 prostatic adenocarcinoma.    ICD-10-CM   1. Malignant neoplasm of prostate ([place])  C61     HISTORY OF PRESENT ILLNESS: [name] is a 74 y.o. male with a diagnosis of prostate cancer. He was initially diagnosed with Gleason 4+4 prostate cancer on biopsy in 02/2005. At that time, he opted to proceed with a radical retropubic prostatectomy performed by Dr. Jeffie Pollock on 04/16/2005 . Pathology from the procedure revealed Gleason 4+3 prostatic adenocarcinoma involving both lobes and the right seminal vesicle with negative margins. All four pelvic lymph nodes were negative (0/4).  He was referred to Dr. Valere Dross in 01/2007 for consideration of salvage radiotherapy for biochemical recurrence of disease. He received salvage radiotherapy to the prostate fossa- completed on 05/11/2007. On review of the patient's prior radiation records, his pelvic lymph nodes were not treated at that time.  Since that time, his PSA has remained elevated with some fluctuation between 8.5 - 10 over the years, tipping over 10 for the first time back in 11/2017. It increased to 14.3 in 12/2018 and further to 16.4 in 03/2019. This prompted a CT A/P on 04/28/2019 showing stable-appearing borderline enlarged retroperitoneal lymph nodes, similar since 2017, with a single left external iliac lymph node slightly larger. There was no pelvic mass or pelvic adenopathy and no findings to suggest solid organ metastatic disease or osseous metastatic disease. A bone scan performed the same day showed a very subtle  focus of activity along left border of sternum. Subsequent x-ray of the sternum was negative for metastatic disease.  Most recently, his PSA increased further to 23.7 on 08/11/2019. This prompted further evaluation with an Axumin PET scan which was performed on 08/24/2019 and demonstrated intense radiotracer activity within the chain of lymph nodes extending from the left common iliac artery to the left periaortic nodal station. There was no evidence of visceral or skeletal metastases.    He received his first dose of ADT with firmagon on 10/17/2019. He will return for his next dose with Eligard on 11/13/19.  The patient reviewed the imaging and lab results with his urologist and he has kindly been referred today for discussion of potential radiation treatment options.   PREVIOUS RADIATION THERAPY: Yes  02/2007-05/11/2007: Prostate Fossa (Dr. Valere Dross)  The patient's largest fields covered only the prostate fossa and not the nodes as displayed below      PAST MEDICAL HISTORY:  Past Medical History:  Diagnosis Date  . Arthritis   . Diabetes mellitus   . Diverticulosis   . Dyslipidemia   . ED (erectile dysfunction)   . Hemorrhoids   . Hypertension   . Prostate cancer ([place])       PAST SURGICAL HISTORY: Past Surgical History:  Procedure Laterality Date  . COLONOSCOPY  2008   Dr.hung  . PROSTATECTOMY    . TRABECULECTOMY Right   . tubla adenoma N/A 12/2016    FAMILY HISTORY:  Family History  Problem Relation Age of Onset  . Diabetes Mother   . Diabetes Sister   . Diabetes Brother   . Diabetes Maternal Aunt   .  Diabetes Maternal Uncle   . Diabetes Maternal Grandmother   . Diabetes Maternal Grandfather   . Breast cancer Neg Hx   . Colon cancer Neg Hx   . Prostate cancer Neg Hx   . Pancreatic cancer Neg Hx     SOCIAL HISTORY:  Social History   Socioeconomic History  . Marital status: Married    Spouse name: Not on file  . Number of children: Not on file  . Years of  education: Not on file  . Highest education level: Not on file  Occupational History  . Not on file  Tobacco Use  . Smoking status: Former Research scientist (life sciences)  . Smokeless tobacco: Never Used  Substance and Sexual Activity  . Alcohol use: No  . Drug use: No  . Sexual activity: Not Currently  Other Topics Concern  . Not on file  Social History Narrative  . Not on file   Social Determinants of Health   Financial Resource Strain:   . Difficulty of Paying Living Expenses:   Food Insecurity:   . Worried About Charity fundraiser in the Last Year:   . Arboriculturist in the Last Year:   Transportation Needs:   . Film/video editor (Medical):   Marland Kitchen Lack of Transportation (Non-Medical):   Physical Activity:   . Days of Exercise per Week:   . Minutes of Exercise per Session:   Stress:   . Feeling of Stress :   Social Connections:   . Frequency of Communication with Friends and Family:   . Frequency of Social Gatherings with Friends and Family:   . Attends Religious Services:   . Active Member of Clubs or Organizations:   . Attends Archivist Meetings:   Marland Kitchen Marital Status:   Intimate Partner Violence:   . Fear of Current or Ex-Partner:   . Emotionally Abused:   Marland Kitchen Physically Abused:   . Sexually Abused:     ALLERGIES: Ace inhibitors  MEDICATIONS:  Current Outpatient Medications  Medication Sig Dispense Refill  . aspirin 81 MG tablet Take 81 mg by mouth daily.    . benzonatate (TESSALON) 100 MG capsule Take by mouth.    . Brinzolamide-Brimonidine (SIMBRINZA) 1-0.2 % SUSP Place 1 drop into both eyes 3 times daily.    . calcium-vitamin D (OSCAL WITH D) 500-200 MG-UNIT per tablet Take 1 tablet by mouth daily.      . fexofenadine (ALLEGRA) 180 MG tablet Take 180 mg by mouth daily.    . fluticasone (FLONASE) 50 MCG/ACT nasal spray Place into the nose.    Marland Kitchen GABAPENTIN PO Take 1 capsule by mouth 2 (two) times daily.    Marland Kitchen glipiZIDE (GLUCOTROL) 5 MG tablet Take 2.5 mg by mouth 2  (two) times daily before a meal.    . Glycerin-Polysorbate 80 (REFRESH DRY EYE THERAPY OP) Place 1 drop into both eyes as needed (DRY EYES).    Marland Kitchen HYDROcodone-acetaminophen (NORCO/VICODIN) 5-325 MG tablet Take by mouth.    . hypromellose (GENTEAL) 0.3 % GEL ophthalmic ointment     . latanoprost (XALATAN) 0.005 % ophthalmic solution Place 1 drop into the right eye nightly.    . loperamide (IMODIUM) 2 MG capsule Take 2 mg by mouth 4 (four) times daily as needed for diarrhea or loose stools. 1 capsule every 4 hours as needed for diarrhea. Not to exceed 8 tabs/24hr    . losartan (COZAAR) 100 MG tablet Take 100 mg by mouth daily.    Marland Kitchen  metFORMIN (GLUCOPHAGE) 500 MG tablet Take 500 mg by mouth 2 (two) times daily with a meal.      . Multiple Vitamin (MULTIVITAMIN) capsule Take by mouth.    . Netarsudil Dimesylate 0.02 % SOLN INSTILL ONE DROP IN BOTH EYES DAILY    . pravastatin (PRAVACHOL) 10 MG tablet Take 10 mg by mouth daily.    . prednisoLONE acetate (PRED FORTE) 1 % ophthalmic suspension Place 1 drop into the right eye 4 times daily.    . proparacaine (ALCAINE) 0.5 % ophthalmic solution     . psyllium (METAMUCIL) 58.6 % packet Take 1 packet by mouth daily.      . timolol (TIMOPTIC) 0.5 % ophthalmic solution Place 1 drop into both eyes 2 times daily.    Marland Kitchen zolpidem (AMBIEN CR) 6.25 MG CR tablet Take 10 mg by mouth at bedtime as needed.       No current facility-administered medications for this encounter.    REVIEW OF SYSTEMS:  On review of systems, the patient reports that he is doing well overall. He denies any chest pain, shortness of breath, cough, fevers, chills, night sweats, unintended weight changes. He denies any bowel disturbances, and denies abdominal pain, nausea or vomiting. He denies any new musculoskeletal or joint aches or pains. He reports postprostatectomy erectile dysfunction and urinary incontinence. His IPSS is 18, indicating moderate urinary symptoms. A complete review of systems  is obtained and is otherwise negative.    PHYSICAL EXAM:  Wt Readings from Last 3 Encounters:  09/27/19 273 lb (123.8 kg)  02/21/19 272 lb 9.6 oz (123.7 kg)  02/18/18 271 lb 6.4 oz (123.1 kg)   Temp Readings from Last 3 Encounters:  09/27/19 97.9 F (36.6 C) (Oral)  02/21/19 98 F (36.7 C)  02/18/18 (!) 97.4 F (36.3 C)   BP Readings from Last 3 Encounters:  09/27/19 138/84  02/21/19 110/64  02/18/18 134/86   Pulse Readings from Last 3 Encounters:  09/27/19 83  02/21/19 68  02/18/18 68   Pain Assessment Pain Score: 3  Pain Frequency: Intermittent Pain Loc: Groin/10  Physical exam not performed in light of telephone consult visit format.   KPS = 90  100 - Normal; no complaints; no evidence of disease. 90   - Able to carry on normal activity; minor signs or symptoms of disease. 80   - Normal activity with effort; some signs or symptoms of disease. 60   - Cares for self; unable to carry on normal activity or to do active work. 60   - Requires occasional assistance, but is able to care for most of his personal needs. 50   - Requires considerable assistance and frequent medical care. 15   - Disabled; requires special care and assistance. 78   - Severely disabled; hospital admission is indicated although death not imminent. 64   - Very sick; hospital admission necessary; active supportive treatment necessary. 10   - Moribund; fatal processes progressing rapidly. 0     - Dead  Karnofsky DA, Abelmann Whiting, Craver LS and Burchenal [facility] [phone]) The use of the nitrogen mustards in the palliative treatment of carcinoma: with particular reference to bronchogenic carcinoma Cancer 1 634-56  LABORATORY DATA:  Lab Results  Component Value Date   WBC 4.9 02/13/2016   HGB 14.2 02/13/2016   HCT 43.3 02/13/2016   MCV 87.3 02/13/2016   PLT 215 02/13/2016   Lab Results  Component Value Date   NA 141 02/13/2016   K 4.5  02/13/2016   CL 104 02/13/2016   CO2 25 02/13/2016   Lab  Results  Component Value Date   ALT 18 02/13/2016   AST 28 02/13/2016   ALKPHOS 72 02/13/2016   BILITOT 1.0 02/13/2016     RADIOGRAPHY: LE VENOUS  Result Date: 09/27/2019  Lower Venous DVTStudy Indications: Toe/ foot swelling.  Performing Technologist: June Leap RDMS, RVT  Examination Guidelines: A complete evaluation includes B-mode imaging, spectral Doppler, color Doppler, and power Doppler as needed of all accessible portions of each vessel. Bilateral testing is considered an integral part of a complete examination. Limited examinations for reoccurring indications may be performed as noted. The reflux portion of the exam is performed with the patient in reverse Trendelenburg.  +---------+---------------+---------+-----------+----------+--------------+ RIGHT    CompressibilityPhasicitySpontaneityProperties               +---------+---------------+---------+-----------+----------+--------------+ CFV                                                   Not visualized +---------+---------------+---------+-----------+----------+--------------+ FV Prox  Full           Yes      Yes                                 +---------+---------------+---------+-----------+----------+--------------+ FV Mid   Full                                                        +---------+---------------+---------+-----------+----------+--------------+ FV DistalFull                                                        +---------+---------------+---------+-----------+----------+--------------+ PFV      Full                                                        +---------+---------------+---------+-----------+----------+--------------+ POP      Full           Yes      Yes                                 +---------+---------------+---------+-----------+----------+--------------+ PTV      Full                                                         +---------+---------------+---------+-----------+----------+--------------+ PERO     Full                                                        +---------+---------------+---------+-----------+----------+--------------+   +----+---------------+---------+-----------+----------+--------------+  LEFTcompressibilityphasicityspontaneityproperties               +----+---------------+---------+-----------+----------+--------------+ CFV                                              Not visualized +----+---------------+---------+-----------+----------+--------------+     Summary: RIGHT: - There is no evidence of deep vein thrombosis in the lower extremity.  - No cystic structure found in the popliteal fossa.   *See table(s) above for measurements and observations. Electronically signed by Curt Jews MD on 09/27/2019 at 7:28:28 PM.    Final       IMPRESSION/PLAN: This visit was conducted via Telephone to spare the patient unnecessary potential exposure in the healthcare setting during the current COVID-19 pandemic. 1. 74 y.o. 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 We discussed the patient's workup and outlined the nature of prostate cancer in this setting. The patient is familiar with radiation from his prior salvage radiation therapy to the prostate fossa with Dr. Valere Dross in 2008. Upon reviewing Dr. Charlton Amor notes, it appears the pelvic lymph nodes were not treated at that time, given his negative nodal pathology from his RALP. Therefore we feel comfortable treating the involved pelvic lymph nodes noted on his recent PET scan. The recommendations is for a 5 fraction course of stereotactic body radiation therapy (SBRT) to the involved pelvic nodes. We discussed and outlined the risks, benefits, short and long-term effects associated with radiotherapy in this setting. He was  encouraged to ask questions, which were answered to his stated satisfacion.  At the end of the conversation, the patient is interested in moving forward with a 5 fraction course of SBRT to the pelvic nodes, concurrent with ADT. He has already started ADT with a Firmagon injection on 10/17/19. We will share our discussion with Dr. Jeffie Pollock and move forward with treatment planning accordingly. The patient appears to have a good understanding of his disease and our treatment recommendations which are of curative intent.  He is tentatively scheduled for CT SIM/treatment planning on Tuesday, 10/31/19 at 9:30am, in anticipation of beginning SBRT in the near future.   Given current concerns for patient exposure during the COVID-19 pandemic, this encounter was conducted via telephone. The patient was notified in advance and was offered a MyChart meeting to allow for face to face communication but unfortunately reported that he did not have the appropriate resources/technology to support such a visit and instead preferred to proceed with telephone consult. The patient has given verbal consent for this type of encounter. The time spent during this encounter was 45 minutes. The attendants for this meeting include Tyler Pita MD, Ashlyn Bruning PA-C, [place], and patient, [name]. During the encounter, Tyler Pita MD, Ashlyn Bruning PA-C, and scribe, Wilburn Mylar were located at [place].  Patient, [name] was located at home.    Nicholos Johns, PA-C    Tyler Pita, MD  [facility] Direct Dial: [phone]  Fax: [phone] [email or web address]  Skype  LinkedIn  This document serves as a record of services personally performed by Tyler Pita, MD and Freeman Caldron, PA-C. It was created on their behalf by Wilburn Mylar, a trained medical scribe. The creation of this record is based  on the scribe's personal observations and the provider's statements to them. This document has been checked and approved by the attending provider.

## 2019-10-24 NOTE — Progress Notes (Deleted)
The patient's largest fields covered only the prostate fossa and not the nodes as displayed below

## 2019-10-24 NOTE — Patient Instructions (Signed)
Coronavirus (COVID-19) Are you at risk?  Are you at risk for the Coronavirus (COVID-19)?  To be considered HIGH RISK for Coronavirus (COVID-19), you have to meet the following criteria:  . Traveled to [place], [place], [place], [place] or [place]; or in the [place] to [place], [place], [place], or [place]; and have fever, cough, and shortness of breath within the last 2 weeks of travel OR . Been in close contact with a person diagnosed with COVID-19 within the last 2 weeks and have fever, cough, and shortness of breath . IF YOU DO NOT MEET THESE CRITERIA, YOU ARE CONSIDERED LOW RISK FOR COVID-19.  What to do if you are HIGH RISK for COVID-19?  . If you are having a medical emergency, call 911. . Seek medical care right away. Before you go to a doctor's office, urgent care or emergency department, call ahead and tell them about your recent travel, contact with someone diagnosed with COVID-19, and your symptoms. You should receive instructions from your physician's office regarding next steps of care.  . When you arrive at healthcare provider, tell the healthcare staff immediately you have returned from visiting [place], [place], [place], [place] or [place]; or traveled in the [place] to [place], [place], [place], or [place]; in the last two weeks or you have been in close contact with a person diagnosed with COVID-19 in the last 2 weeks.   . Tell the health care staff about your symptoms: fever, cough and shortness of breath. . After you have been seen by a medical provider, you will be either: o Tested for (COVID-19) and discharged home on quarantine except to seek medical care if symptoms worsen, and asked to  - Stay home and avoid contact with others until you get your results (4-5 days)  - Avoid travel on public transportation if possible (such as bus, train, or airplane) or o Sent to the Emergency Department by EMS for evaluation, COVID-19 testing, and possible  admission depending on your condition and test results.  What to do if you are LOW RISK for COVID-19?  Reduce your risk of any infection by using the same precautions used for avoiding the common cold or flu:  . Wash your hands often with soap and warm water for at least 20 seconds.  If soap and water are not readily available, use an alcohol-based hand sanitizer with at least 60% alcohol.  . If coughing or sneezing, cover your mouth and nose by coughing or sneezing into the elbow areas of your shirt or coat, into a tissue or into your sleeve (not your hands). . Avoid shaking hands with others and consider head nods or verbal greetings only. . Avoid touching your eyes, nose, or mouth with unwashed hands.  . Avoid close contact with people who are sick. . Avoid places or events with large numbers of people in one location, like concerts or sporting events. . Carefully consider travel plans you have or are making. . If you are planning any travel outside or inside the [place], visit the CDC's Travelers' Health webpage for the latest health notices. . If you have some symptoms but not all symptoms, continue to monitor at home and seek medical attention if your symptoms worsen. . If you are having a medical emergency, call 911.   ADDITIONAL HEALTHCARE OPTIONS FOR PATIENTS  [facility] / e-Visit: [email or web address]         [facility]: [phone]  [facility]: [phone]                   [facility]: [phone]   

## 2019-10-27 DIAGNOSIS — C61 Malignant neoplasm of prostate: Secondary | ICD-10-CM | POA: Insufficient documentation

## 2019-10-30 ENCOUNTER — Encounter: Payer: Self-pay | Admitting: Medical Oncology

## 2019-10-30 NOTE — Progress Notes (Signed)
Spoke with patient to introduce myself as the prostate nurse navigator and discuss my role. I was unable to meet him 6/8, when he consulted with Dr. Tammi Klippel. He states he has had radiation in the past so he is familiar with CT simulation. He had prostatectomy in 2006 and radiation to prostate fossa in 2008. He did receive Firmagon 6/1, and states his abdomen is still sore at the injection sites. He is scheduled for CT simulation tomorrow at 9:30am. I asked him to call me with questions or concerns. He voiced understanding.

## 2019-10-31 ENCOUNTER — Other Ambulatory Visit: Payer: Self-pay

## 2019-10-31 ENCOUNTER — Encounter: Payer: Self-pay | Admitting: Medical Oncology

## 2019-10-31 ENCOUNTER — Ambulatory Visit
Admission: RE | Admit: 2019-10-31 | Discharge: 2019-10-31 | Disposition: A | Discharge status: Home / Self Care | Payer: Medicare HMO | Source: Ambulatory Visit | Attending: Radiation Oncology | Admitting: Radiation Oncology

## 2019-10-31 DIAGNOSIS — Z510 Encounter for antineoplastic radiation therapy: Secondary | ICD-10-CM | POA: Insufficient documentation

## 2019-10-31 DIAGNOSIS — Z923 Personal history of irradiation: Secondary | ICD-10-CM | POA: Diagnosis not present

## 2019-10-31 DIAGNOSIS — C778 Secondary and unspecified malignant neoplasm of lymph nodes of multiple regions: Secondary | ICD-10-CM | POA: Diagnosis not present

## 2019-10-31 DIAGNOSIS — C61 Malignant neoplasm of prostate: Secondary | ICD-10-CM | POA: Insufficient documentation

## 2019-11-01 DIAGNOSIS — C61 Malignant neoplasm of prostate: Secondary | ICD-10-CM | POA: Insufficient documentation

## 2019-11-01 NOTE — Progress Notes (Signed)
°  Radiation Oncology         (336) [phone] ________________________________  Name: [name] MRN: [id number]  Date: 10/31/2019  DOB: April 10, 1946  SIMULATION AND TREATMENT PLANNING NOTE    ICD-10-CM   1. 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  NARRATIVE:  The patient was brought to the [facility].  Identity was confirmed.  All relevant records and images related to the planned course of therapy were reviewed.  The patient freely provided informed written consent to proceed with treatment after reviewing the details related to the planned course of therapy. The consent form was witnessed and verified by the simulation staff.  Then, the patient was set-up in a stable reproducible supine position for radiation therapy.  A vacuum lock pillow device was custom fabricated to position his legs in a reproducible immobilized position. CT images were obtained.  Surface markings were placed.  The CT images were loaded into the planning software.  Then the prostate target and avoidance structures including the rectum, bladder, bowel and hips were contoured.  Treatment planning then occurred.  The radiation prescription was entered and confirmed.  A total of one complex treatment devices was fabricated. I have requested : Stereotactic Body Radiotherapy (SBRT) is medically necessary for this case for the following reason:  Targeted treatment of oligometastic disease in the involved abdominopelvic lymph nodes.  SPECIAL TREATMENT PROCEDURE:  The planned course of therapy using radiation constitutes a special treatment procedure. Special care is required in the  management of this patient for the following reasons. This treatment constitutes a Special Treatment Procedure for the following reason: [ High dose per fraction requiring special monitoring for increased toxicities of treatment including daily imaging..  The special nature of the planned course of radiotherapy will require increased physician supervision and oversight to ensure patient's safety with optimal treatment outcomes.  PLAN:  The patient will receive 50 Gy in 5 fractions to the involved abdominopelvic lymph nodes.  ________________________________  [name], M.D.

## 2019-11-06 DIAGNOSIS — Z923 Personal history of irradiation: Secondary | ICD-10-CM | POA: Diagnosis not present

## 2019-11-06 DIAGNOSIS — Z510 Encounter for antineoplastic radiation therapy: Secondary | ICD-10-CM | POA: Diagnosis not present

## 2019-11-06 DIAGNOSIS — C61 Malignant neoplasm of prostate: Secondary | ICD-10-CM | POA: Diagnosis not present

## 2019-11-06 DIAGNOSIS — C778 Secondary and unspecified malignant neoplasm of lymph nodes of multiple regions: Secondary | ICD-10-CM | POA: Diagnosis not present

## 2019-11-13 ENCOUNTER — Ambulatory Visit
Admission: RE | Admit: 2019-11-13 | Discharge: 2019-11-13 | Disposition: A | Discharge status: Home / Self Care | Payer: Medicare HMO | Source: Ambulatory Visit | Attending: Radiation Oncology | Admitting: Radiation Oncology

## 2019-11-13 ENCOUNTER — Other Ambulatory Visit: Payer: Self-pay

## 2019-11-13 DIAGNOSIS — C778 Secondary and unspecified malignant neoplasm of lymph nodes of multiple regions: Secondary | ICD-10-CM | POA: Diagnosis not present

## 2019-11-13 DIAGNOSIS — Z923 Personal history of irradiation: Secondary | ICD-10-CM | POA: Diagnosis not present

## 2019-11-13 DIAGNOSIS — Z510 Encounter for antineoplastic radiation therapy: Secondary | ICD-10-CM | POA: Diagnosis not present

## 2019-11-13 DIAGNOSIS — C61 Malignant neoplasm of prostate: Secondary | ICD-10-CM | POA: Diagnosis not present

## 2019-11-14 ENCOUNTER — Ambulatory Visit: Payer: Medicare HMO | Admitting: Radiation Oncology

## 2019-11-15 ENCOUNTER — Ambulatory Visit
Admission: RE | Admit: 2019-11-15 | Discharge: 2019-11-15 | Disposition: A | Discharge status: Home / Self Care | Payer: Medicare HMO | Source: Ambulatory Visit | Attending: Radiation Oncology | Admitting: Radiation Oncology

## 2019-11-15 ENCOUNTER — Other Ambulatory Visit: Payer: Self-pay

## 2019-11-15 DIAGNOSIS — Z510 Encounter for antineoplastic radiation therapy: Secondary | ICD-10-CM | POA: Diagnosis not present

## 2019-11-15 DIAGNOSIS — C61 Malignant neoplasm of prostate: Secondary | ICD-10-CM | POA: Diagnosis not present

## 2019-11-15 DIAGNOSIS — C778 Secondary and unspecified malignant neoplasm of lymph nodes of multiple regions: Secondary | ICD-10-CM | POA: Diagnosis not present

## 2019-11-15 DIAGNOSIS — Z923 Personal history of irradiation: Secondary | ICD-10-CM | POA: Diagnosis not present

## 2019-11-16 ENCOUNTER — Ambulatory Visit: Payer: Medicare HMO | Admitting: Radiation Oncology

## 2019-11-17 ENCOUNTER — Ambulatory Visit
Admission: RE | Admit: 2019-11-17 | Discharge: 2019-11-17 | Disposition: A | Discharge status: Home / Self Care | Payer: Medicare HMO | Source: Ambulatory Visit | Attending: Radiation Oncology | Admitting: Radiation Oncology

## 2019-11-17 ENCOUNTER — Other Ambulatory Visit: Payer: Self-pay

## 2019-11-17 DIAGNOSIS — C61 Malignant neoplasm of prostate: Secondary | ICD-10-CM | POA: Diagnosis not present

## 2019-11-17 DIAGNOSIS — Z923 Personal history of irradiation: Secondary | ICD-10-CM | POA: Diagnosis not present

## 2019-11-17 DIAGNOSIS — Z510 Encounter for antineoplastic radiation therapy: Secondary | ICD-10-CM | POA: Insufficient documentation

## 2019-11-17 DIAGNOSIS — C778 Secondary and unspecified malignant neoplasm of lymph nodes of multiple regions: Secondary | ICD-10-CM | POA: Diagnosis not present

## 2019-11-17 DIAGNOSIS — Z5111 Encounter for antineoplastic chemotherapy: Secondary | ICD-10-CM | POA: Diagnosis not present

## 2019-11-21 ENCOUNTER — Ambulatory Visit
Admission: RE | Admit: 2019-11-21 | Discharge: 2019-11-21 | Disposition: A | Discharge status: Home / Self Care | Payer: Medicare HMO | Source: Ambulatory Visit | Attending: Radiation Oncology | Admitting: Radiation Oncology

## 2019-11-21 DIAGNOSIS — Z923 Personal history of irradiation: Secondary | ICD-10-CM | POA: Diagnosis not present

## 2019-11-21 DIAGNOSIS — C61 Malignant neoplasm of prostate: Secondary | ICD-10-CM | POA: Diagnosis not present

## 2019-11-21 DIAGNOSIS — C778 Secondary and unspecified malignant neoplasm of lymph nodes of multiple regions: Secondary | ICD-10-CM | POA: Diagnosis not present

## 2019-11-21 DIAGNOSIS — Z510 Encounter for antineoplastic radiation therapy: Secondary | ICD-10-CM | POA: Diagnosis not present

## 2019-11-21 DIAGNOSIS — C775 Secondary and unspecified malignant neoplasm of intrapelvic lymph nodes: Secondary | ICD-10-CM

## 2019-11-23 ENCOUNTER — Ambulatory Visit
Admission: RE | Admit: 2019-11-23 | Discharge: 2019-11-23 | Disposition: A | Discharge status: Home / Self Care | Payer: Medicare HMO | Source: Ambulatory Visit | Attending: Radiation Oncology | Admitting: Radiation Oncology

## 2019-11-23 ENCOUNTER — Encounter: Payer: Self-pay | Admitting: Urology

## 2019-11-23 ENCOUNTER — Encounter: Payer: Self-pay | Admitting: Medical Oncology

## 2019-11-23 VITALS — BP 114/82 | HR 84 | Temp 97.9°F | Resp 18 | Wt 264.0 lb

## 2019-11-23 DIAGNOSIS — C772 Secondary and unspecified malignant neoplasm of intra-abdominal lymph nodes: Secondary | ICD-10-CM

## 2019-11-23 DIAGNOSIS — C61 Malignant neoplasm of prostate: Secondary | ICD-10-CM | POA: Diagnosis not present

## 2019-11-23 DIAGNOSIS — Z510 Encounter for antineoplastic radiation therapy: Secondary | ICD-10-CM | POA: Diagnosis not present

## 2019-11-23 DIAGNOSIS — C775 Secondary and unspecified malignant neoplasm of intrapelvic lymph nodes: Secondary | ICD-10-CM

## 2019-11-23 DIAGNOSIS — C778 Secondary and unspecified malignant neoplasm of lymph nodes of multiple regions: Secondary | ICD-10-CM | POA: Diagnosis not present

## 2019-11-23 DIAGNOSIS — Z923 Personal history of irradiation: Secondary | ICD-10-CM | POA: Diagnosis not present

## 2019-11-23 NOTE — Progress Notes (Signed)
I met with the patient in the office today following his fifth and final SBRT treatment to the oligometastatic disease in the abdominal pelvic lymph nodes.  He feels like he tolerated treatment well despite some ongoing diarrhea/loose stools which is manageable.  He specifically denies abdominal pain, nausea, vomiting, dysuria, hematuria, incomplete bladder emptying or incontinence.  [facility] and reports hot flashes and fatigue associated with this.  He has a scheduled follow-up visit with Dr. WJeffie Pollockon 01/23/2020 for repeat labs and I will plan to follow-up with him by telephone on 12/27/2019 for a 1 month follow-up visit.  He is comfortable and in agreement with the stated plan.  He knows to call at anytime with any questions or concerns in the interim.  ANicholos Johns MMS, PA-C [facility] [place] [phone] Fax: [phone]

## 2019-11-23 NOTE — Progress Notes (Signed)
Patient completed radiation therapy today to his internal iliac lymph nodes. Patient reports feeling well overall without any complaints. One month follow up appointment card given. Denies skin changes within treatment field. Denies fatigue. Evaluated further by [employer], PA-C.

## 2019-11-23 NOTE — Progress Notes (Signed)
  Radiation Oncology         (336) [phone] ________________________________  Name: [name] MRN: [id number]  Date: 11/23/2019  DOB: Jun 17, 1945  End of Treatment Note  Diagnosis:   74 y.o. 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     Indication for treatment:  Curative, Definitive SBRT       Radiation treatment dates:   11/13/19 - 11/23/19  Site/dose:   The involved abdominopelvic lymph nodes were treated to 50 Gy in 5 fractions of 10 Gy; concurrent with ADT  Beams/energy:   The patient was treated using stereotactic body radiotherapy according to a 3D conformal radiotherapy plan.  Volumetric arc fields were employed to deliver 6 MV X-rays.  Image guidance was performed with per fraction cone beam CT prior to treatment under personal MD supervision.  Immobilization was achieved using BodyFix Pillow.  Narrative: The patient tolerated radiation treatment relatively well.   He did experience some diarrhea but denies abdominal pain, nausea or vomiting.  He also reported hot flashes and fatigue associated with his ADT but reported that overall, he feels that he is tolerating this well.  Plan: The patient has completed radiation treatment. The patient will return to radiation oncology clinic for routine followup in one month. I advised them to call or return sooner if they have any questions or concerns related to their recovery or treatment. ________________________________  Sheral Apley. Tammi Klippel, M.D.

## 2019-12-25 ENCOUNTER — Telehealth: Payer: Self-pay

## 2019-12-25 ENCOUNTER — Encounter: Payer: Self-pay | Admitting: Urology

## 2019-12-25 NOTE — Progress Notes (Signed)
Patient has follow-up appointment with Ashlyn Bruning PA. Patient states nocturia 3 times per night. Patient denies dysuria. Patient states having regular bowel movements. Patient states urine stream is strong and steady. Patient states that he is not emptying his bladder completely and returning to the bathroom 30 minutes later. Patient states that he has moderate urgency. Patient states that he has leakage all of the time. Patient denies having hesitancy or straining. Patient denies having fatigue. Patient states that he is currently on hormone therapy and has hot flashes all of the time.Patient states that he has a follow-up appointment with [facility] September 13th.

## 2019-12-25 NOTE — Telephone Encounter (Signed)
Spoke with patient in regards to 1 month follow-up appointment with Ashlyn Bruning on 12/27/19 at 3:00pm. Patient verbalized understanding of appointment date and time. Meaningful use, AUA and prostate questions were reviewed. TM

## 2019-12-25 NOTE — Telephone Encounter (Signed)
Left patient a voicemail message to call back in regards to telephone visit with Ashlyn Bruning on 12/27/19 at 3:00pm. Called to review meaningful use, AUA and prostate questions. TM

## 2019-12-27 ENCOUNTER — Ambulatory Visit
Admission: RE | Admit: 2019-12-27 | Discharge: 2019-12-27 | Disposition: A | Discharge status: Home / Self Care | Payer: Medicare HMO | Source: Ambulatory Visit | Attending: Urology | Admitting: Urology

## 2019-12-27 ENCOUNTER — Other Ambulatory Visit: Payer: Self-pay

## 2019-12-27 DIAGNOSIS — C775 Secondary and unspecified malignant neoplasm of intrapelvic lymph nodes: Secondary | ICD-10-CM

## 2019-12-27 DIAGNOSIS — C772 Secondary and unspecified malignant neoplasm of intra-abdominal lymph nodes: Secondary | ICD-10-CM

## 2019-12-27 NOTE — Progress Notes (Signed)
Radiation Oncology         [phone] ________________________________  Name: [name] MRN: [id number]  Date: 12/27/2019  DOB: [birth year]  Post Treatment Note  CC: [name], MD  [name], MD  Diagnosis:    74 y.o.gentleman with oligometastatic disease in the pelvic lymph nodess/p RALP in 03/2005 and salvage fossa XRT in 2083forStagepT3b Gleason 4+3 prostatic adenocarcinoma.     Interval Since Last Radiation:  5 weeks; concurrent with ADT (52-month Eligard injection given on 11/17/2019)  11/13/19 - 11/23/19:  The involved abdominopelvic lymph nodes were treated to 50 Gy in 5 fractions of 10 Gy; concurrent with ADT  02/2007-05/11/2007: Prostate Fossa (Dr. Valere Dross)  Narrative:  I spoke with the patient to conduct his routine scheduled 1 month follow up visit via telephone to spare the patient unnecessary potential exposure in the healthcare setting during the current COVID-19 pandemic.  The patient was notified in advance and gave permission to proceed with this visit format.  He tolerated the radiation treatments relatively well only mild urinary symptoms and loose stools.  He denied abdominal pain, nausea, vomiting, diarrhea or constipation, dysuria, hematuria, incomplete bladder emptying or incontinence.  He continued to tolerate the ADT well throughout his course of radiation despite periodic hot flashes and decreased stamina associated with this.                            On review of systems, the patient states that he is doing very well in general.  He reports complete resolution of the loose stools and is back to his baseline bowel routine at this point.  He denies any urinary symptoms or complaints.  He feels like his energy level is gradually returning but continues with hot flashes and easy fatigability which he associates with the ADT.  He specifically denies dysuria, gross hematuria, excessive daytime frequency, urgency, incomplete bladder emptying or incontinence.   He reports a healthy appetite and is maintaining his weight.  Overall, he is quite pleased with his progress to date.  ALLERGIES:  is allergic to ace inhibitors.  Meds: Current Outpatient Medications  Medication Sig Dispense Refill  . aspirin 81 MG tablet Take 81 mg by mouth daily.    . benzonatate (TESSALON) 100 MG capsule Take by mouth.    . Brinzolamide-Brimonidine (SIMBRINZA) 1-0.2 % SUSP Place 1 drop into both eyes 3 times daily.    . calcium-vitamin D (OSCAL WITH D) 500-200 MG-UNIT per tablet Take 1 tablet by mouth daily.      . fexofenadine (ALLEGRA) 180 MG tablet Take 180 mg by mouth daily.    . fluticasone (FLONASE) 50 MCG/ACT nasal spray Place into the nose.    Marland Kitchen GABAPENTIN PO Take 1 capsule by mouth 2 (two) times daily.    Marland Kitchen glipiZIDE (GLUCOTROL) 5 MG tablet Take 2.5 mg by mouth 2 (two) times daily before a meal.    . Glycerin-Polysorbate 80 (REFRESH DRY EYE THERAPY OP) Place 1 drop into both eyes as needed (DRY EYES).    Marland Kitchen HYDROcodone-acetaminophen (NORCO/VICODIN) 5-325 MG tablet Take by mouth.    . hypromellose (GENTEAL) 0.3 % GEL ophthalmic ointment     . latanoprost (XALATAN) 0.005 % ophthalmic solution Place 1 drop into the right eye nightly.    . loperamide (IMODIUM) 2 MG capsule Take 2 mg by mouth 4 (four) times daily as needed for diarrhea or loose stools. 1 capsule every 4 hours as needed for diarrhea.  Not to exceed 8 tabs/24hr    . losartan (COZAAR) 100 MG tablet Take 100 mg by mouth daily.    . metFORMIN (GLUCOPHAGE) 500 MG tablet Take 500 mg by mouth 2 (two) times daily with a meal.      . Multiple Vitamin (MULTIVITAMIN) capsule Take by mouth.    . Netarsudil Dimesylate 0.02 % SOLN INSTILL ONE DROP IN BOTH EYES DAILY    . pravastatin (PRAVACHOL) 10 MG tablet Take 10 mg by mouth daily.    . prednisoLONE acetate (PRED FORTE) 1 % ophthalmic suspension Place 1 drop into the right eye 4 times daily.    . proparacaine (ALCAINE) 0.5 % ophthalmic solution     . psyllium  (METAMUCIL) 58.6 % packet Take 1 packet by mouth daily.      . timolol (TIMOPTIC) 0.5 % ophthalmic solution Place 1 drop into both eyes 2 times daily.    Marland Kitchen zolpidem (AMBIEN CR) 6.25 MG CR tablet Take 10 mg by mouth at bedtime as needed.       No current facility-administered medications for this encounter.    Physical Findings:  vitals were not taken for this visit.   /Unable to assess due to telephone follow-up visit format.  Lab Findings: Lab Results  Component Value Date   WBC 4.9 02/13/2016   HGB 14.2 02/13/2016   HCT 43.3 02/13/2016   MCV 87.3 02/13/2016   PLT 215 02/13/2016     Radiographic Findings: No results found.  Impression/Plan: 1.  74 y.o.gentleman with oligometastatic disease in the pelvic lymph nodess/p RALP in 03/2005 and salvage fossa XRT in 2093forStagepT3b Gleason 4+3 prostatic adenocarcinoma.    He appears to have recovered well from the effects of his recent SBRT and is currently without complaints.  He has a scheduled follow-up visit for labs on 01/23/2020 and then we will see Dr. Jeffie Pollock on 91/07/2019 to review those.  We discussed that while we are happy to continue to participate in his care if clinically indicated, at this point, we will plan to see him back on an as-needed basis.  He knows that he is welcome to call at anytime with any questions or concerns related to his previous radiation.    Nicholos Johns, PA-C

## 2020-01-23 DIAGNOSIS — C61 Malignant neoplasm of prostate: Secondary | ICD-10-CM | POA: Diagnosis not present

## 2020-01-29 DIAGNOSIS — N393 Stress incontinence (female) (male): Secondary | ICD-10-CM | POA: Diagnosis not present

## 2020-01-29 DIAGNOSIS — C778 Secondary and unspecified malignant neoplasm of lymph nodes of multiple regions: Secondary | ICD-10-CM | POA: Diagnosis not present

## 2020-01-29 DIAGNOSIS — C61 Malignant neoplasm of prostate: Secondary | ICD-10-CM | POA: Diagnosis not present

## 2020-02-23 ENCOUNTER — Ambulatory Visit: Payer: Medicare HMO | Admitting: Family Medicine

## 2020-02-27 ENCOUNTER — Ambulatory Visit (INDEPENDENT_AMBULATORY_CARE_PROVIDER_SITE_OTHER): Payer: Medicare HMO | Admitting: Family Medicine

## 2020-02-27 ENCOUNTER — Encounter: Payer: Self-pay | Admitting: Family Medicine

## 2020-02-27 ENCOUNTER — Other Ambulatory Visit: Payer: Self-pay

## 2020-02-27 VITALS — BP 128/82 | HR 73 | Temp 96.7°F | Ht 74.0 in | Wt 266.2 lb

## 2020-02-27 DIAGNOSIS — D126 Benign neoplasm of colon, unspecified: Secondary | ICD-10-CM

## 2020-02-27 DIAGNOSIS — C61 Malignant neoplasm of prostate: Secondary | ICD-10-CM

## 2020-02-27 DIAGNOSIS — Z0000 Encounter for general adult medical examination without abnormal findings: Secondary | ICD-10-CM | POA: Diagnosis not present

## 2020-02-27 DIAGNOSIS — E1139 Type 2 diabetes mellitus with other diabetic ophthalmic complication: Secondary | ICD-10-CM

## 2020-02-27 DIAGNOSIS — J301 Allergic rhinitis due to pollen: Secondary | ICD-10-CM

## 2020-02-27 DIAGNOSIS — H409 Unspecified glaucoma: Secondary | ICD-10-CM

## 2020-02-27 DIAGNOSIS — H42 Glaucoma in diseases classified elsewhere: Secondary | ICD-10-CM

## 2020-02-27 DIAGNOSIS — E1159 Type 2 diabetes mellitus with other circulatory complications: Secondary | ICD-10-CM | POA: Diagnosis not present

## 2020-02-27 DIAGNOSIS — E118 Type 2 diabetes mellitus with unspecified complications: Secondary | ICD-10-CM | POA: Diagnosis not present

## 2020-02-27 DIAGNOSIS — R232 Flushing: Secondary | ICD-10-CM

## 2020-02-27 DIAGNOSIS — E1169 Type 2 diabetes mellitus with other specified complication: Secondary | ICD-10-CM

## 2020-02-27 DIAGNOSIS — Z8619 Personal history of other infectious and parasitic diseases: Secondary | ICD-10-CM | POA: Diagnosis not present

## 2020-02-27 DIAGNOSIS — M1990 Unspecified osteoarthritis, unspecified site: Secondary | ICD-10-CM

## 2020-02-27 DIAGNOSIS — Z9849 Cataract extraction status, unspecified eye: Secondary | ICD-10-CM

## 2020-02-27 DIAGNOSIS — C775 Secondary and unspecified malignant neoplasm of intrapelvic lymph nodes: Secondary | ICD-10-CM

## 2020-02-27 DIAGNOSIS — B351 Tinea unguium: Secondary | ICD-10-CM | POA: Diagnosis not present

## 2020-02-27 DIAGNOSIS — I152 Hypertension secondary to endocrine disorders: Secondary | ICD-10-CM

## 2020-02-27 DIAGNOSIS — E785 Hyperlipidemia, unspecified: Secondary | ICD-10-CM

## 2020-02-27 LAB — LIPID PANEL
Chol/HDL Ratio: 2.9 ratio (ref 0.0–5.0)
Cholesterol, Total: 139 mg/dL (ref 100–199)
HDL: 48 mg/dL (ref >39)
LDL Chol Calc (NIH): 77 mg/dL (ref 0–99)
Triglycerides: 69 mg/dL (ref 0–149)
VLDL Cholesterol Cal: 14 mg/dL (ref 5–40)

## 2020-02-27 LAB — CBC WITH DIFFERENTIAL/PLATELET
Basophils Absolute: 0 10*3/uL (ref 0.0–0.2)
Basos: 1 %
EOS (ABSOLUTE): 0.2 10*3/uL (ref 0.0–0.4)
Eos: 4 %
Hematocrit: 38.6 % (ref 37.5–51.0)
Hemoglobin: 12.3 g/dL — ABNORMAL LOW (ref 13.0–17.7)
Immature Grans (Abs): 0 10*3/uL (ref 0.0–0.1)
Immature Granulocytes: 0 %
Lymphocytes Absolute: 0.9 10*3/uL (ref 0.7–3.1)
Lymphs: 21 %
MCH: 28.5 pg (ref 26.6–33.0)
MCHC: 31.9 g/dL (ref 31.5–35.7)
MCV: 90 fL (ref 79–97)
Monocytes Absolute: 0.7 10*3/uL (ref 0.1–0.9)
Monocytes: 15 %
Neutrophils Absolute: 2.6 10*3/uL (ref 1.4–7.0)
Neutrophils: 59 %
Platelets: 246 10*3/uL (ref 150–450)
RBC: 4.31 x10E6/uL (ref 4.14–5.80)
RDW: 14.5 % (ref 11.6–15.4)
WBC: 4.5 10*3/uL (ref 3.4–10.8)

## 2020-02-27 LAB — COMPREHENSIVE METABOLIC PANEL
ALT: 18 IU/L (ref 0–44)
AST: 23 IU/L (ref 0–40)
Albumin/Globulin Ratio: 1.5 (ref 1.2–2.2)
Albumin: 4.3 g/dL (ref 3.7–4.7)
Alkaline Phosphatase: 81 IU/L (ref 44–121)
BUN/Creatinine Ratio: 12 (ref 10–24)
BUN: 15 mg/dL (ref 8–27)
Bilirubin Total: 0.7 mg/dL (ref 0.0–1.2)
CO2: 26 mmol/L (ref 20–29)
Calcium: 9.6 mg/dL (ref 8.6–10.2)
Chloride: 106 mmol/L (ref 96–106)
Creatinine, Ser: 1.3 mg/dL — ABNORMAL HIGH (ref 0.76–1.27)
GFR calc Af Amer: 62 mL/min/{1.73_m2} (ref >59)
GFR calc non Af Amer: 53 mL/min/{1.73_m2} — ABNORMAL LOW (ref >59)
Globulin, Total: 2.8 g/dL (ref 1.5–4.5)
Glucose: 156 mg/dL — ABNORMAL HIGH (ref 65–99)
Potassium: 4.9 mmol/L (ref 3.5–5.2)
Sodium: 141 mmol/L (ref 134–144)
Total Protein: 7.1 g/dL (ref 6.0–8.5)

## 2020-02-27 LAB — POCT UA - MICROALBUMIN
Albumin/Creatinine Ratio, Urine, POC: 13
Creatinine, POC: 108.3 mg/dL
Microalbumin Ur, POC: 14.1 mg/L

## 2020-02-27 LAB — POCT GLYCOSYLATED HEMOGLOBIN (HGB A1C): Hemoglobin A1C: 7.6 % — AB (ref 4.0–5.6)

## 2020-02-27 MED ORDER — SERTRALINE HCL 25 MG PO TABS: 25.0000 mg | ORAL_TABLET | Freq: Every day | ORAL | 3 refills | Status: DC

## 2020-02-27 NOTE — Progress Notes (Signed)
[name] is a 74 y.o. male who presents for annual wellness visit,CPE and follow-up on chronic medical conditions.  He has prostate cancer and has had radiation as well as hormone therapy.  He is now getting hormone therapy and is causing difficulty with hot flashes.  He is followed by urology locally for this.  He has a history of glaucoma as well as cataracts and is followed at the [facility] for this.  He gets most of his care through the [facility] as well.  He does have a history of colonic polyps and is scheduled for follow-up colonoscopy in 2023.  Presently he is taking Metformin and glipizide for his diabetes.  He continues on Pravachol for his cholesterol and is having no difficulty with that.  He is on losartan for his blood pressure.  He is also taking gabapentin for neuropathy and having no difficulty with that.  He does see ophthalmology regularly.  He is having some difficulty with arthritis and seems to have this under good control and does take Tylenol as needed.   Immunizations and Health Maintenance Immunization History  Administered Date(s) Administered  . Fluad Quad(high Dose 65+) 02/15/2020  . Influenza Split 02/04/2011, 02/04/2012  . Influenza Whole 02/18/2009, 02/03/2010  . Influenza, High Dose Seasonal PF 02/06/2014, 02/12/2015, 02/13/2016, 02/16/2017  . Influenza,inj,Quad PF,6+ Mos 02/06/2013  . Influenza-Unspecified 02/14/2018, 02/11/2019  . Pneumococcal Conjugate-13 02/06/2014  . Pneumococcal Polysaccharide-23 02/04/2012  . Tdap 02/03/2010, 09/07/2019  . Zoster 03/25/2006  . Zoster Recombinat (Shingrix) 03/25/2017, 06/02/2017   Health Maintenance Due  Topic Date Due  . FOOT EXAM  Never done  . COVID-19 Vaccine (1) Never done  . OPHTHALMOLOGY EXAM  04/07/2019  . HEMOGLOBIN A1C  08/22/2019    Last colonoscopy: 12/16/16 Last PSA: 08/11/19 Dentist: N/A Ophtho: Q three months Exercise: walking 7 days a week for 40 min.  Other doctors caring for patient include: Dr. Manuella Ghazi  eye, Dr.[facility]   Advanced Directives: Does Patient Have a Medical Advance Directive?: Yes Type of Advance Directive: [place] will Does patient want to make changes to medical advance directive?: No - Patient declined Copy of [place] in Chart?: Yes - validated most recent copy scanned in chart (See row information)  Depression screen:  See questionnaire below.     Depression screen [facility] 2/9 02/27/2020 02/21/2019 02/18/2018 02/16/2017 02/13/2016  Decreased Interest 0 0 0 0 0  Down, Depressed, Hopeless 0 0 0 0 0  PHQ - 2 Score 0 0 0 0 0    Fall Screen: See Questionaire below.   Fall Risk  02/27/2020 02/21/2019 02/18/2018 02/16/2017 02/13/2016  Falls in the past year? 1 0 - No No  Number falls in past yr: 1 0 2 or more - -  Injury with Fall? 0 0 No - -  Risk for fall due to : - - Impaired vision - -    ADL screen:  See questionnaire below.  Functional Status Survey: Is the patient deaf or have difficulty hearing?: No Does the patient have difficulty seeing, even when wearing glasses/contacts?: Yes (out right eye) Does the patient have difficulty concentrating, remembering, or making decisions?: No Does the patient have difficulty walking or climbing stairs?: No Does the patient have difficulty dressing or bathing?: No Does the patient have difficulty doing errands alone such as visiting a doctor's office or shopping?: No   Review of Systems  Constitutional: -, -unexpected weight change, -anorexia, -fatigue Allergy: -sneezing, -itching, -congestion Dermatology:  denies changing moles, rash, lumps ENT: -runny nose, -ear pain, -sore throat,  Cardiology:  -chest pain, -palpitations, -orthopnea, Respiratory: -cough, -shortness of breath, -dyspnea on exertion, -wheezing,  Gastroenterology: -abdominal pain, -nausea, -vomiting, -diarrhea, -constipation, -dysphagia Hematology: -bleeding or bruising problems Musculoskeletal: -arthralgias,  -myalgias, -joint swelling, -back pain, - Ophthalmology: -vision changes,  Urology: -dysuria, -difficulty urinating,  -urinary frequency, -urgency, incontinence Neurology: -, -numbness, , -memory loss, -falls, -dizziness    PHYSICAL EXAM:   General Appearance: Alert, cooperative, no distress, appears stated age Head: Normocephalic, without obvious abnormality, atraumatic Eyes: PERRL, conjunctiva/corneas clear, EOM's intact, fundi benign Ears: Normal TM's and external ear canals Nose: Nares normal, mucosa normal, no drainage or sinus   tenderness Throat: Lips, mucosa, and tongue normal; teeth and gums normal Neck: Supple, no lymphadenopathy, thyroid:no enlargement/tenderness/nodules; no carotid bruit or JVD Lungs: Clear to auscultation bilaterally without wheezes, rales or ronchi; respirations unlabored Heart: Regular rate and rhythm, S1 and S2 normal, no murmur, rub or gallop Abdomen: Soft, non-tender, nondistended, normoactive bowel sounds, no masses, no hepatosplenomegaly Extremities: No clubbing, cyanosis or edema.  All of his toenails are thickened Pulses: 2+ and symmetric all extremities Skin: Skin color, texture, turgor normal, no rashes or lesions Lymph nodes: Cervical, supraclavicular, and axillary nodes normal Neurologic: CNII-XII intact, normal strength, sensation and gait; reflexes 2+ and symmetric throughout   Psych: Normal mood, affect, hygiene and grooming Hemoglobin A1c is 7.6 ASSESSMENT/PLAN: Routine general medical examination at a health care facility - Plan: CBC with Differential/Platelet, Comprehensive metabolic panel, Lipid panel  Diabetes mellitus type 2 with complications (HCC) - Plan: CBC with Differential/Platelet, Comprehensive metabolic panel, Lipid panel, POCT glycosylated hemoglobin (Hb A1C), POCT UA - Microalbumin  Hypertension associated with diabetes ([place])  Hyperlipidemia associated with type 2 diabetes mellitus (HCC)  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  He will keep in touch concerning the benefit of that.  He will continue on his present medication regimen.  He does have an appointment to see ophthalmology.  Continue to treat the arthritis conservatively. His diabetes seems to be under fairly good control.  He will follow up with GI concerning colonoscopy.  Continue to treat the allergies as needed.   minutes of aerobic activity at least 5 days/week;  Immunization recommendations discussed.  Colonoscopy recommendations reviewed.   Medicare Attestation I have personally reviewed: The patient's medical and social history Their use of alcohol, tobacco or illicit drugs Their current medications and supplements The patient's functional ability including ADLs,fall risks, home safety risks, cognitive, and hearing and visual impairment Diet and physical activities Evidence for depression or mood disorders  The patient's weight, height, and BMI have been recorded in the chart.  I have made referrals, counseling, and provided education to the patient based on review of the above and I have provided the patient with a written personalized care plan for preventive services.     [name], MD   02/27/2020

## 2020-03-04 ENCOUNTER — Telehealth: Payer: Self-pay

## 2020-03-04 NOTE — Telephone Encounter (Signed)
Hot flashes

## 2020-03-04 NOTE — Telephone Encounter (Signed)
Pt  Advised that he needs a DX code for the new med you sent in for hot flashes, . Please advise .  Fax number is  [phone].

## 2020-03-05 ENCOUNTER — Telehealth: Payer: Self-pay

## 2020-03-05 NOTE — Telephone Encounter (Signed)
Error

## 2020-03-05 NOTE — Telephone Encounter (Signed)
Avised pt that script was sent in. [facility]

## 2020-03-05 NOTE — Telephone Encounter (Signed)
I need it written on script pad to fax over. [facility]

## 2020-04-29 DIAGNOSIS — C61 Malignant neoplasm of prostate: Secondary | ICD-10-CM | POA: Diagnosis not present

## 2020-05-06 DIAGNOSIS — N393 Stress incontinence (female) (male): Secondary | ICD-10-CM | POA: Diagnosis not present

## 2020-05-06 DIAGNOSIS — C778 Secondary and unspecified malignant neoplasm of lymph nodes of multiple regions: Secondary | ICD-10-CM | POA: Diagnosis not present

## 2020-05-06 DIAGNOSIS — C61 Malignant neoplasm of prostate: Secondary | ICD-10-CM | POA: Diagnosis not present

## 2020-07-29 DIAGNOSIS — C61 Malignant neoplasm of prostate: Secondary | ICD-10-CM | POA: Diagnosis not present

## 2020-08-05 DIAGNOSIS — C61 Malignant neoplasm of prostate: Secondary | ICD-10-CM | POA: Diagnosis not present

## 2020-08-20 ENCOUNTER — Ambulatory Visit (INDEPENDENT_AMBULATORY_CARE_PROVIDER_SITE_OTHER): Payer: Medicare HMO

## 2020-08-20 ENCOUNTER — Other Ambulatory Visit: Payer: Self-pay

## 2020-08-20 DIAGNOSIS — Z23 Encounter for immunization: Secondary | ICD-10-CM | POA: Diagnosis not present

## 2020-10-30 DIAGNOSIS — C61 Malignant neoplasm of prostate: Secondary | ICD-10-CM | POA: Diagnosis not present

## 2020-11-06 DIAGNOSIS — C61 Malignant neoplasm of prostate: Secondary | ICD-10-CM | POA: Diagnosis not present

## 2020-11-06 DIAGNOSIS — N393 Stress incontinence (female) (male): Secondary | ICD-10-CM | POA: Diagnosis not present

## 2020-11-06 DIAGNOSIS — C778 Secondary and unspecified malignant neoplasm of lymph nodes of multiple regions: Secondary | ICD-10-CM | POA: Diagnosis not present

## 2021-02-05 ENCOUNTER — Telehealth: Payer: Self-pay | Admitting: Family Medicine

## 2021-02-05 DIAGNOSIS — C61 Malignant neoplasm of prostate: Secondary | ICD-10-CM | POA: Diagnosis not present

## 2021-02-05 NOTE — Progress Notes (Signed)
  Chronic Care Management   Outreach Note  02/05/2021 Name: [name] MRN: [id number] DOB: [birth year]  Referred by: [name], MD Reason for referral : Chronic Care Management   An unsuccessful telephone outreach was attempted today. The patient was referred to the pharmacist for assistance with care management and care coordination.   Follow Up Plan:   Tatjana Dellinger Upstream Scheduler

## 2021-02-12 ENCOUNTER — Telehealth: Payer: Self-pay | Admitting: Family Medicine

## 2021-02-12 ENCOUNTER — Telehealth: Payer: Self-pay | Admitting: Pharmacist

## 2021-02-12 DIAGNOSIS — E230 Hypopituitarism: Secondary | ICD-10-CM | POA: Diagnosis not present

## 2021-02-12 DIAGNOSIS — C778 Secondary and unspecified malignant neoplasm of lymph nodes of multiple regions: Secondary | ICD-10-CM | POA: Diagnosis not present

## 2021-02-12 DIAGNOSIS — N393 Stress incontinence (female) (male): Secondary | ICD-10-CM | POA: Diagnosis not present

## 2021-02-12 DIAGNOSIS — C61 Malignant neoplasm of prostate: Secondary | ICD-10-CM | POA: Diagnosis not present

## 2021-02-12 NOTE — Progress Notes (Signed)
  Chronic Care Management   Note  02/12/2021 Name: [name] MRN: [id number] DOB: 06-23-1945  [name] is a 75 y.o. year old male who is a primary care patient of [employer], Elyse Jarvis, MD. I reached out to [place] Poot by phone today in response to a referral sent by Mr. Rankin Nitschke's PCP, [name], MD.   Mr. Highley was given information about Chronic Care Management services today including:  CCM service includes personalized support from designated clinical staff supervised by his physician, including individualized plan of care and coordination with other care providers 24/7 contact phone numbers for assistance for urgent and routine care needs. Service will only be billed when office clinical staff spend 20 minutes or more in a month to coordinate care. Only one practitioner may furnish and bill the service in a calendar month. The patient may stop CCM services at any time (effective at the end of the month) by phone call to the office staff.   Patient agreed to services and verbal consent obtained.   Follow up plan:   Tatjana Secretary/administrator

## 2021-02-12 NOTE — Chronic Care Management (AMB) (Signed)
Chronic Care Management Pharmacy Assistant   Name: [name]  MRN: [id number] DOB: 03/28/1946  [name] is an 75 y.o. year old male who presents for his initial CCM visit with the clinical pharmacist.  Reason for Encounter: Chart Prep for initial office visit on 02/13/21 with [name] the clinical pharmacist.    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    Recent office visits:  02/27/20 Billy Alexanders MD (PCP) - seen for routine general medical examination. Patient started on sertraline 25mg  daily. Colonoscopy recommended.   Recent consult visits:  11/06/20 [name] (Urology) - seen for malignant neoplasm of prostate and lymph node regions. No medication changes or follow up noted.   09/17/20 Billy Jaffe MD (Ophthalmology) - seen for Panuveitis of right eye. Procedure of eye performed and follow up in 12 weeks.   Hospital visits:  None in previous 6 months  Medications: Outpatient Encounter Medications as of 02/12/2021  Medication Sig   aspirin 81 MG tablet Take 81 mg by mouth daily.   azaTHIOprine (IMURAN) 50 MG tablet Take by mouth.   benzonatate (TESSALON) 100 MG capsule Take by mouth.   Brinzolamide-Brimonidine (SIMBRINZA) 1-0.2 % SUSP Place 1 drop into both eyes 3 times daily.   calcium-vitamin D (OSCAL WITH D) 500-200 MG-UNIT per tablet Take 1 tablet by mouth daily.     fexofenadine (ALLEGRA) 180 MG tablet Take 180 mg by mouth daily.   fluticasone (FLONASE) 50 MCG/ACT nasal spray Place into the nose.   GABAPENTIN PO Take 1 capsule by mouth 2 (two) times daily.   glipiZIDE (GLUCOTROL) 5 MG tablet Take 2.5 mg by mouth 2 (two) times daily before a meal.   Glycerin-Polysorbate 80 (REFRESH DRY EYE THERAPY OP) Place 1 drop into both eyes as needed (DRY EYES).   HYDROcodone-acetaminophen (NORCO/VICODIN) 5-325 MG tablet Take by mouth.    hypromellose (GENTEAL) 0.3 % GEL ophthalmic ointment    ketorolac (ACULAR) 0.5 % ophthalmic solution Place 1 drop into the right eye 4 times daily.   latanoprost (XALATAN) 0.005 % ophthalmic solution Place 1 drop into the right eye nightly.   loperamide (IMODIUM) 2 MG capsule Take 2 mg by mouth 4 (four) times daily as needed for diarrhea or loose stools. 1 capsule every 4 hours as needed for diarrhea. Not to exceed 8 tabs/24hr   losartan (COZAAR) 100 MG tablet Take 100 mg by mouth daily.   metFORMIN (GLUCOPHAGE) 500 MG tablet Take 500 mg by mouth 2 (two) times daily with a meal.     Multiple Vitamin (MULTIVITAMIN) capsule Take by mouth.   Netarsudil Dimesylate 0.02 % SOLN INSTILL ONE DROP IN BOTH EYES DAILY   pravastatin (PRAVACHOL) 10 MG tablet Take 10 mg by mouth daily.   prednisoLONE acetate (PRED FORTE) 1 % ophthalmic suspension Place 1 drop into the right eye 4 times daily.   proparacaine (ALCAINE) 0.5 % ophthalmic solution    psyllium (METAMUCIL) 58.6 % packet Take 1 packet by mouth daily.     sertraline (ZOLOFT) 25 MG tablet Take 1 tablet (25 mg total) by mouth daily.   timolol (TIMOPTIC) 0.5 % ophthalmic solution Place 1 drop into both eyes 2 times daily.   zolpidem (AMBIEN CR) 6.25 MG CR tablet Take 10 mg by mouth at bedtime as needed.     No facility-administered encounter medications on file as of 02/12/2021.   Fill History: No dispense history listed  in chart.  Initial Questions: Have you seen any other providers since your last visit? Patient saw his Urologist today.   Any changes in your medications or health? Patient got his Pneumonia shot recently. Patient stated he has prostate cancer and bad eyes.  Any side effects from any medications? No issues from medications to patients knowledge.  Do you have any symptoms or problems not managed by your medications? No. Patient feels that he is receiving proper medications for his health issues.  Any concerns about your health  right now? Patient is concerned about his eye vision and his prostate cancer.  Has your provider asked that you check blood pressure, blood sugar, or follow special diet at home? Patient stated he is a type 2 diabetic and he does check his blood sugar and follow a diet. Patient stated his blood sugar stays around 130.  Do you get any type of exercise on a regular basis? Patient walks 2 miles everyday and mows and weed eats his lawn weekly and tends to his garden.  Can you think of a goal you would like to reach for your health? Patient would like to improve his vision somehow and live a long life.   Do you have any problems getting your medications? No. Patient gets all of his medications through the [place]. He is [place] Veteran.   Is there anything that you would like to discuss during the appointment? Not that he can think of at this time.   Please bring medications and supplements to appointment.  Care Gaps:  AWV - scheduled for 02/27/21 Foot exam - never done Ophthalmology exam - overdue since 04/07/19 Hemoglobin A1C - overdue since 08/27/20 Flu vaccine - due Covid-19 vaccine booster 5 - due  Star Rating Drugs:  Glipizide 5mg  - no fill history listed in chart Losartan 100mg  - no fill history listed in chart Metformin 500mg  - no fill history in chart Pravastatin 10mg  - no fill history in chart Patient uses the [place].  [place]  Clinical Pharmacist Assistant [phone]

## 2021-02-13 ENCOUNTER — Ambulatory Visit (INDEPENDENT_AMBULATORY_CARE_PROVIDER_SITE_OTHER): Payer: Medicare HMO | Admitting: Pharmacist

## 2021-02-13 DIAGNOSIS — I152 Hypertension secondary to endocrine disorders: Secondary | ICD-10-CM

## 2021-02-13 DIAGNOSIS — R32 Unspecified urinary incontinence: Secondary | ICD-10-CM | POA: Insufficient documentation

## 2021-02-13 DIAGNOSIS — E118 Type 2 diabetes mellitus with unspecified complications: Secondary | ICD-10-CM

## 2021-02-13 NOTE — Patient Instructions (Signed)
Hi [name],  It was great to get to meet you over the telephone! Below is a summary of some of the topics we discussed.   Please reach out to me if you have any questions or need anything before our follow up!  Best, Maddie  [name], PharmD, [facility]   Visit Information   Goals Addressed   None    Patient Care Plan: CCM Pharmacy Care Plan     Problem Identified: Problem: Hypertension, Hyperlipidemia, Diabetes, and Glaucoma and Insomnia      Long-Range Goal: Patient-Specific Goal   Start Date: 02/13/2021  Expected End Date: 02/13/2022  This Visit's Progress: On track  Priority: High  Note:   Current Barriers:  Unable to independently monitor therapeutic efficacy  Pharmacist Clinical Goal(s):  Patient will achieve adherence to monitoring guidelines and medication adherence to achieve therapeutic efficacy through collaboration with PharmD and provider.   Interventions: 1:1 collaboration with [name], MD regarding development and update of comprehensive plan of care as evidenced by provider attestation and co-signature Inter-disciplinary care team collaboration (see longitudinal plan of care) Comprehensive medication review performed; medication list updated in electronic medical record  Hypertension (BP goal <130/80) -Controlled -Current treatment: Losartan 100 mg 1 tablet daily -Medications previously tried: lisinopril (angioedema)  -Current home readings: does not check (BP machine) -Current dietary habits: wife pays attention to sodium; she doesn't read package labels; he salts his food -Current exercise habits: walking daily -Denies hypotensive/hypertensive symptoms -Educated on Exercise goal of 150 minutes per week; Importance of home blood pressure monitoring; Proper BP monitoring technique; -Counseled to monitor BP at home weekly, document, and provide log at future  appointments -Counseled on diet and exercise extensively Recommended to continue current medication  Hyperlipidemia: (LDL goal < 70) -Not ideally controlled -Current treatment: Pravastatin 40 mg 1 tablet daily -Medications previously tried: none  -Current dietary patterns: sausage in morning; chinese wings; usually baked chicken and boiled foods; uses vegetable oil; lots of salad and greens -Current exercise habits: walking daily -Educated on Cholesterol goals;  Benefits of statin for ASCVD risk reduction; Importance of limiting foods high in cholesterol; -Counseled on diet and exercise extensively Recommended to continue current medication Patient's dose was recently increased.  Diabetes (A1c goal <7%) -Uncontrolled -Current medications: Glipizide 5 mg 1 tablet twice daily  Metformin 1000 mg 1 tablet twice daily Jardiance 25 mg 1/2 tablet daily -Medications previously tried: none  -Current home glucose readings fasting glucose: 100-130 (checks 2-3 times a week) post prandial glucose: not checking often -Denies hypoglycemic/hyperglycemic symptoms -Current meal patterns:  breakfast: doesn't eat  lunch: sometimes skips  dinner: chicken, vegetables snacks: cut back on sweets drinks: diet soda when eating, lots of water -Current exercise: walking  -Educated on A1c and blood sugar goals; Exercise goal of 150 minutes per week; Benefits of routine self-monitoring of blood sugar; -Counseled to check feet daily and get yearly eye exams -Counseled on diet and exercise extensively Recommended to continue current medication Recommended alternating times of day for checking blood sugars.  Allergic rhinitis (Goal: minimize symptoms) -Controlled -Current treatment  Benzonatate 200 mg as needed Fluticasone 50 mcg/act 1 spray as needed Loratadine 10 mg 1 tablet daily as needed -Medications previously tried: Allegra  -Recommended to continue current medication  Insomnia (Goal:  improve quality and quantity of sleep) -Controlled -Current treatment  Nyquil as needed -Medications previously tried: Ambien  -Counseled on avoidance of products containing Beandryl due to risk of falls.  Recommended trial of melatonin 2 or 3 mg daily.  Glaucoma/pletarsis (Goal: minimize symptoms) -Controlled -Current treatment  Latanoprost 0.005%  1 drop in the boths eye at bedtime Timolol 0.5% 1 drop in both eyes twice daily Netarsudil 0.02% 1 drop in both eyes daily Ketorolac 0.5% right eye four times a day Simbrinza each eye three times a day Azathioprine 50 mg 2 tablets twice a day Refresh as needed -Medications previously tried: n/a  -Recommended to continue current medication  Neuropathy (Goal: minimize numbness) -Controlled -Current treatment  Gabapentin 1 capsule twice daily -Medications previously tried: none  -Recommended to continue current medication   Health Maintenance -Vaccine gaps:  influenza, COVID booster -Current therapy:  Calcium citrate - vitamin D 630 mg -  units - 1 tablet daily Aspirin 81 mg 1 tablet daily -Educated on Cost vs benefit of each product must be carefully weighed by individual consumer -Patient is satisfied with current therapy and denies issues -Recommended to continue current medication  Patient Goals/Self-Care Activities Patient will:  - take medications as prescribed check glucose a few times a week, document, and provide at future appointments check blood pressure weekly, document, and provide at future appointments  Follow Up Plan: Telephone follow up appointment with care management team member scheduled for: 6 months      [name] was given information about Chronic Care Management services today including:  CCM service includes personalized support from designated clinical staff supervised by his physician, including individualized plan of care and coordination with other care providers 24/7 contact phone numbers for  assistance for urgent and routine care needs. Standard insurance, coinsurance, copays and deductibles apply for chronic care management only during months in which we provide at least 20 minutes of these services. Most insurances cover these services at 100%, however patients may be responsible for any copay, coinsurance and/or deductible if applicable. This service may help you avoid the need for more expensive face-to-face services. Only one practitioner may furnish and bill the service in a calendar month. The patient may stop CCM services at any time (effective at the end of the month) by phone call to the office staff.  Patient agreed to services and verbal consent obtained.   The patient verbalized understanding of instructions, educational materials, and care plan provided today and agreed to receive a mailed copy of patient instructions, educational materials, and care plan.  Telephone follow up appointment with pharmacy team member scheduled for: 6 months  Viona Gilmore, [facility]

## 2021-02-13 NOTE — Progress Notes (Signed)
Chronic Care Management Pharmacy Note  02/13/2021 Name:  [name] MRN:  [id number] DOB:  September 13, 1945  Summary: BP at goal < 130/80 A1c not at goal < 7%  Recommendations/Changes made from today's visit: -Recommended routine monitoring of BP at home -Recommended alternating times of checking blood sugars based on increase in A1c. -Recommended consistently taking glipizide with meals  Plan: Follow up BP and DM assessment in 1-2 months   Subjective: [name] is an 75 y.o. year old male who is a primary patient of [name], MD.  The CCM team was consulted for assistance with disease management and care coordination needs.    Engaged with patient by telephone for initial visit in response to provider referral for pharmacy case management and/or care coordination services.   Consent to Services:  The patient was given the following information about Chronic Care Management services today, agreed to services, and gave verbal consent: 1. CCM service includes personalized support from designated clinical staff supervised by the primary care provider, including individualized plan of care and coordination with other care providers 2. 24/7 contact phone numbers for assistance for urgent and routine care needs. 3. Service will only be billed when office clinical staff spend 20 minutes or more in a month to coordinate care. 4. Only one practitioner may furnish and bill the service in a calendar month. 5.The patient may stop CCM services at any time (effective at the end of the month) by phone call to the office staff. 6. The patient will be responsible for cost sharing (co-pay) of up to 20% of the service fee (after annual deductible is met). Patient agreed to services and consent obtained.  Patient Care Team: [name], MD as PCP - General (Family Medicine) Viona Gilmore, [facility] as Pharmacist (Pharmacist)  Recent office visits: 02/27/20 [name] (PCP) - seen  for routine general medical examination. Patient started on sertraline 59m daily. Colonoscopy recommended.   Recent consult visits: 01/21/21 Rupal Chiniwalla ([facility]): Unable to access notes.  12/18/20 RDwana Melena MD (ophthalmology): Patient presented for uveitis evaluation.  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 No medication changes or follow up noted.    09/17/20 KRoney JaffeMD (Ophthalmology) - seen for Panuveitis of right eye. Procedure of eye performed and follow up in 12 weeks.   Hospital visits: None in previous 6 months   Objective:  Lab Results  Component Value Date   CREATININE 1.30 (H) 02/27/2020   BUN 15 02/27/2020   GFRNONAA 53 (L) 02/27/2020   GFRAA 62 02/27/2020   NA 141 02/27/2020   K 4.9 02/27/2020   CALCIUM 9.6 02/27/2020   CO2 26 02/27/2020   GLUCOSE 156 (H) 02/27/2020    Lab Results  Component Value Date/Time   HGBA1C 7.6 (A) 02/27/2020 10:44 AM   HGBA1C 7.3 (A) 02/21/2019 09:52 AM   MICROALBUR 14.1 02/27/2020 11:33 AM   MICROALBUR <5.0 02/13/2016 09:40 AM    Last diabetic Eye exam:  Lab Results  Component Value Date/Time   HMDIABEYEEXA No Retinopathy 04/06/2018 12:00 AM    Last diabetic Foot exam: No results found for: HMDIABFOOTEX   Lab Results  Component Value Date   CHOL 139 02/27/2020   HDL 48 02/27/2020   LDLCALC 77 02/27/2020   TRIG 69 02/27/2020   CHOLHDL 2.9 02/27/2020    Hepatic Function Latest Ref Rng & Units 02/27/2020 02/13/2016 02/12/2015  Total Protein 6.0 - 8.5 g/dL  7.1 6.9 7.1  Albumin 3.7 - 4.7 g/dL 4.3 4.0 4.0  AST 0 - 40 IU/L _0 ALT 0 - 44 IU/L _1 Alk Phosphatase 44 - 121 IU/L 81 72 79  Total Bilirubin 0.0 - 1.2 mg/dL 0.7 1.0 0.8    No results found for: TSH, FREET4  CBC Latest Ref Rng & Units 02/27/2020 02/13/2016 02/12/2015  WBC 3.4 - 10.8 x10E3/uL 4.5 4.9 6.4  Hemoglobin 13.0 - 17.7 g/dL 12.3(L) 14.2 14.4  Hematocrit 37.5 - 51.0 % 38.6 43.3  43.0  Platelets 150 - 450 x10E3/uL 246 215 231    No results found for: VD25OH  Clinical ASCVD: No  The 10-year ASCVD risk score (Arnett DK, et al., 2019) is: 48%   Values used to calculate the score:     Age: 89 years     Sex: Male     Is Non-Hispanic African American: Yes     Diabetic: Yes     Tobacco smoker: Yes     Systolic Blood Pressure: 903 mmHg     Is BP treated: Yes     HDL Cholesterol: 50 mg/dL     Total Cholesterol: 140 mg/dL    Depression screen [facility] 2/9 02/27/2020 02/21/2019 02/18/2018  Decreased Interest 0 0 0  Down, Depressed, Hopeless 0 0 0  PHQ - 2 Score 0 0 0     Social History   Tobacco Use  Smoking Status Former  Smokeless Tobacco Never   BP Readings from Last 3 Encounters:  02/27/20 128/82  11/23/19 114/82  09/27/19 138/84   Pulse Readings from Last 3 Encounters:  02/27/20 73  11/23/19 84  09/27/19 83   Wt Readings from Last 3 Encounters:  02/27/20 266 lb 3.2 oz (120.7 kg)  11/23/19 264 lb (119.7 kg)  09/27/19 273 lb (123.8 kg)   BMI Readings from Last 3 Encounters:  02/27/20 34.18 kg/m  11/23/19 32.14 kg/m  09/27/19 33.23 kg/m    Assessment/Interventions: Review of patient past medical history, allergies, medications, health status, including review of consultants reports, laboratory and other test data, was performed as part of comprehensive evaluation and provision of chronic care management services.   SDOH:  (Social Determinants of Health) assessments and interventions performed: Yes SDOH Interventions    Flowsheet Row Most Recent Value  SDOH Interventions   Financial Strain Interventions Intervention Not Indicated  Transportation Interventions Intervention Not Indicated      SDOH Screenings   Alcohol Screen: Not on file  Depression (PHQ2-9): Low Risk    PHQ-2 Score: 0  Financial Resource Strain: Low Risk    Difficulty of Paying Living Expenses: Not hard at all  Food Insecurity: Not on file  Housing: Not on file   Physical Activity: Not on file  Social Connections: Not on file  Stress: Not on file  Tobacco Use: Medium Risk   Smoking Tobacco Use: Former   Smokeless Tobacco Use: Never  Transportation Needs: No Transportation Needs   Lack of Transportation (Medical): No   Lack of Transportation (Non-Medical): No   Patient usually gets up around 6:30 and takes his morning medications including his eye medications and then he goes for a 2 mile walk. Some days he cuts the grass instead of walking. He usually eats breakfast and then watches TV and then ready for medications again.   Patient spends a lot of time in his garden and then eats supper around 4 pm and then takes medications and then takes eye  medications again around 10pm. Patient cannot see very well right now and only has about 2% of vision in one eye. He does try to read the newspaper.  Patient's wife brings him to eye doctor's appointments. He is still driving. Patient has [employer] and he uses the pharmacy over there so he doesn't have to pay. He uses 3 pillboxes at a time for his medications. Patient doesn't miss doses of medications very often.   Patient uses diapers/pads because of his bladder cancer. Patient is not having pain right now.  Patient doesn't sleep more than 3 hours per night. He doesn't feel tired during the day but does take naps during the day. He does take the Nyquill sometimes. Recommended melatonin over the counter.  CCM Care Plan  Allergies  Allergen Reactions   Ace Inhibitors Other (See Comments) and Swelling    Angioedema  Angioedema     Medications Reviewed Today     Reviewed by Viona Gilmore, [facility] (Pharmacist) on 02/13/21 at 1429  Med List Status: <None>   Medication Order Taking? Sig Documenting Provider Last Dose Status Informant  aspirin 81 MG tablet [id number] Yes Take 81 mg by mouth daily. [provider] Taking Active   azaTHIOprine (IMURAN) 50 MG tablet [id number] Yes Take 100 mg by  mouth in the morning and at bedtime. [provider] Taking Active   benzonatate (TESSALON) 100 MG capsule [id number] Yes Take by mouth. [provider] Taking Active   Brinzolamide-Brimonidine [facility]) 1-0.2 % SUSP [id number] Yes Place 1 drop into both eyes 3 times daily. [provider] Taking Active   Calcium Carbonate-Vitamin D 600-125 MG-UNIT TABS [id number] Yes Take 1 tablet by mouth daily.   [provider] Taking Active   empagliflozin (JARDIANCE) 25 MG TABS tablet [id number] Yes Take 12.5 mg by mouth daily. [provider] Taking Active   fluticasone (FLONASE) 50 MCG/ACT nasal spray [id number] Yes Place 2 sprays into both nostrils daily as needed. [provider] Taking Active   gabapentin (NEURONTIN) 600 MG tablet [id number] Yes Take 1 tablet by mouth 2 (two) times daily. [provider] Taking Active   glipiZIDE (GLUCOTROL) 5 MG tablet [id number] Yes Take 5 mg by mouth 2 (two) times daily before a meal. [provider] Taking Active   Glycerin-Polysorbate 80 (REFRESH DRY EYE THERAPY OP) [id number] Yes Place 1 drop into both eyes as needed (DRY EYES). [provider] Taking Active   ketorolac (ACULAR) 0.5 % ophthalmic solution [id number] Yes Place 1 drop into the right eye 4 times daily. [provider] Taking Active   latanoprost (XALATAN) 0.005 % ophthalmic solution [id number] Yes Place 1 drop into the right eye nightly. [provider] Taking Active   loratadine (CLARITIN) 10 MG tablet [id number] Yes Take 10 mg by mouth daily as needed for allergies. [provider] Taking Active   losartan (COZAAR) 100 MG tablet [id number] Yes Take 100 mg by mouth daily. [provider] Taking Active   metFORMIN (GLUCOPHAGE) 1000 MG tablet [id number] Yes Take 1,000 mg by mouth 2 (two) times daily with a meal. [provider] Taking Active   Multiple Vitamin (MULTIVITAMIN ADULT PO) [id number] Yes  Take by mouth. [provider] Taking Active   Multiple Vitamin (MULTIVITAMIN) capsule [id number] Yes Take by mouth. [provider] Taking Active   Netarsudil Dimesylate 0.02 % SOLN [id number] Yes INSTILL ONE DROP IN BOTH EYES DAILY [provider] Taking Active   pravastatin (PRAVACHOL) 40 MG tablet [id number] Yes  Take 40 mg by mouth daily. [provider] Taking Active   timolol (TIMOPTIC) 0.5 % ophthalmic solution [id number] Yes Place 1 drop into both eyes 2 times daily. 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 06/27/2019   Primary open angle glaucoma (POAG) of right eye, severe stage 06/27/2019   Retinal edema 06/27/2019   History of cataract surgery 04/12/2018   Adenomatous polyp of colon 02/18/2018   History of hepatitis C 02/19/2016   Macular puckering of retina, right eye 05/28/2015   Nuclear sclerotic cataract of left eye 05/28/2015   Rhinitis, allergic 02/06/2014   Diabetes mellitus type 2 with complications ([place]) 93/73/4287   Hypertension associated with diabetes ([place]) 02/04/2011   Hyperlipidemia associated with type 2 diabetes mellitus ([place]) 02/04/2011   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 ([place]) 02/04/2011   Arthritis 02/04/2011   Glaucoma 02/04/2011    Immunization History  Administered Date(s) Administered   Fluad Quad(high Dose 65+) 02/15/2020   Influenza Split 02/04/2011, 02/04/2012   Influenza Whole 02/18/2009, 02/03/2010   Influenza, High Dose Seasonal PF 02/06/2014, 02/12/2015, 02/13/2016, 02/16/2017   Influenza,inj,Quad PF,6+ Mos 02/06/2013   Influenza-Unspecified 02/14/2018, 02/11/2019   PFIZER  Comirnaty(Gray Top)Covid-19 Tri-Sucrose Vaccine 08/20/2020   PFIZER(Purple Top)SARS-COV-2 Vaccination 06/30/2019, 07/25/2019, 02/16/2020   Pneumococcal Conjugate-13 02/06/2014   Pneumococcal Polysaccharide-23 02/04/2012, 11/29/2020   Tdap 02/03/2010, 09/07/2019   Zoster Recombinat (Shingrix) 03/25/2017, 06/02/2017   Zoster, Live 03/25/2006    Conditions to be addressed/monitored:  Hypertension, Hyperlipidemia, Diabetes, and Glaucoma and Insomnia  Care Plan : CCM Pharmacy Care Plan  Updates made by Viona Gilmore, [place] since 02/13/2021 12:00 AM     Problem: Problem: Hypertension, Hyperlipidemia, Diabetes, and Glaucoma and Insomnia      Long-Range Goal: Patient-Specific Goal   Start Date: 02/13/2021  Expected End Date: 02/13/2022  This Visit's Progress: On track  Priority: High  Note:   Current Barriers:  Unable to independently monitor therapeutic efficacy  Pharmacist Clinical Goal(s):  Patient will achieve adherence to monitoring guidelines and medication adherence to achieve therapeutic efficacy through collaboration with PharmD and provider.   Interventions: 1:1 collaboration with [name], MD regarding development and update of comprehensive plan of care as evidenced by provider attestation and co-signature Inter-disciplinary care team collaboration (see longitudinal plan of care) Comprehensive medication review performed; medication list updated in electronic medical record  Hypertension (BP goal <130/80) -Controlled -Current treatment: Losartan 100 mg 1 tablet daily -Medications previously tried: lisinopril (angioedema)  -Current home readings: does not check (BP machine) -Current dietary habits: wife pays attention to sodium; she doesn't read package labels; he salts his food -Current exercise habits: walking daily -Denies hypotensive/hypertensive symptoms -Educated on Exercise goal of 150 minutes per week; Importance of home blood pressure  monitoring; Proper BP monitoring technique; -Counseled to monitor BP at home weekly, document, and provide log at future appointments -Counseled on diet and exercise extensively Recommended to continue current medication  Hyperlipidemia: (LDL goal < 70) -Not ideally controlled -Current treatment: Pravastatin 40 mg 1 tablet daily -Medications previously tried: none  -Current dietary patterns: sausage in morning; chinese wings; usually baked chicken and boiled foods; uses vegetable oil; lots of  salad and greens -Current exercise habits: walking daily -Educated on Cholesterol goals;  Benefits of statin for ASCVD risk reduction; Importance of limiting foods high in cholesterol; -Counseled on diet and exercise extensively Recommended to continue current medication Patient's dose was recently increased.  Diabetes (A1c goal <7%) -Uncontrolled -Current medications: Glipizide 5 mg 1 tablet twice daily  Metformin 1000 mg 1 tablet twice daily Jardiance 25 mg 1/2 tablet daily -Medications previously tried: none  -Current home glucose readings fasting glucose: 100-130 (checks 2-3 times a week) post prandial glucose: not checking often -Denies hypoglycemic/hyperglycemic symptoms -Current meal patterns:  breakfast: doesn't eat  lunch: sometimes skips  dinner: chicken, vegetables snacks: cut back on sweets drinks: diet soda when eating, lots of water -Current exercise: walking  -Educated on A1c and blood sugar goals; Exercise goal of 150 minutes per week; Benefits of routine self-monitoring of blood sugar; -Counseled to check feet daily and get yearly eye exams -Counseled on diet and exercise extensively Recommended to continue current medication Recommended alternating times of day for checking blood sugars.  Allergic rhinitis (Goal: minimize symptoms) -Controlled -Current treatment  Benzonatate 200 mg as needed Fluticasone 50 mcg/act 1 spray as needed Loratadine 10 mg 1 tablet  daily as needed -Medications previously tried: Allegra  -Recommended to continue current medication  Insomnia (Goal: improve quality and quantity of sleep) -Controlled -Current treatment  Nyquil as needed -Medications previously tried: Ambien  -Counseled on avoidance of products containing Beandryl due to risk of falls. Recommended trial of melatonin 2 or 3 mg daily.  Glaucoma/pletarsis (Goal: minimize symptoms) -Controlled -Current treatment  Latanoprost 0.005%  1 drop in the boths eye at bedtime Timolol 0.5% 1 drop in both eyes twice daily Netarsudil 0.02% 1 drop in both eyes daily Ketorolac 0.5% right eye four times a day Simbrinza each eye three times a day Azathioprine 50 mg 2 tablets twice a day Refresh as needed -Medications previously tried: n/a  -Recommended to continue current medication  Neuropathy (Goal: minimize numbness) -Controlled -Current treatment  Gabapentin 1 capsule twice daily -Medications previously tried: none  -Recommended to continue current medication   Health Maintenance -Vaccine gaps:  influenza, COVID booster -Current therapy:  Calcium citrate - vitamin D 630 mg -  units - 1 tablet daily Aspirin 81 mg 1 tablet daily -Educated on Cost vs benefit of each product must be carefully weighed by individual consumer -Patient is satisfied with current therapy and denies issues -Recommended to continue current medication  Patient Goals/Self-Care Activities Patient will:  - take medications as prescribed check glucose a few times a week, document, and provide at future appointments check blood pressure weekly, document, and provide at future appointments  Follow Up Plan: Telephone follow up appointment with care management team member scheduled for: 6 months      Medication Assistance: None required.  Patient affirms current coverage meets needs.  Compliance/Adherence/Medication fill history: Care Gaps: Foot exam, eye exam, influenza, COVID  booster  Star-Rating Drugs: No fill history due to [place]  Patient's preferred pharmacy is:  [facility] [place], [place] [place] [place] [address] Phone: [phone] Fax: [phone]  [facility] [place], [place] - [facility] [address] [place] [address] Phone: [phone] Fax: [phone]  Uses pill box? Yes - 3 weeks at a time Pt endorses 99% compliance  We discussed: Current pharmacy is  preferred with insurance plan and patient is satisfied with pharmacy services Patient decided to: Continue current medication management strategy  Care Plan and Follow Up Patient Decision:  Patient agrees to Care Plan and Follow-up.  Plan: Telephone follow up appointment with care management team member scheduled for:  6 months  [name], PharmD, [place] Pharmacist [place] at [place] [phone]

## 2021-02-14 DIAGNOSIS — E1159 Type 2 diabetes mellitus with other circulatory complications: Secondary | ICD-10-CM | POA: Diagnosis not present

## 2021-02-14 DIAGNOSIS — I152 Hypertension secondary to endocrine disorders: Secondary | ICD-10-CM

## 2021-02-14 DIAGNOSIS — E118 Type 2 diabetes mellitus with unspecified complications: Secondary | ICD-10-CM

## 2021-02-18 ENCOUNTER — Other Ambulatory Visit (HOSPITAL_COMMUNITY): Payer: Self-pay | Admitting: Urology

## 2021-02-18 DIAGNOSIS — C61 Malignant neoplasm of prostate: Secondary | ICD-10-CM

## 2021-02-27 ENCOUNTER — Ambulatory Visit (INDEPENDENT_AMBULATORY_CARE_PROVIDER_SITE_OTHER): Payer: Medicare HMO | Admitting: Family Medicine

## 2021-02-27 ENCOUNTER — Other Ambulatory Visit: Payer: Self-pay

## 2021-02-27 ENCOUNTER — Encounter: Payer: Self-pay | Admitting: Family Medicine

## 2021-02-27 VITALS — BP 130/80 | HR 57 | Temp 97.3°F | Ht 74.5 in | Wt 266.6 lb

## 2021-02-27 DIAGNOSIS — Z8619 Personal history of other infectious and parasitic diseases: Secondary | ICD-10-CM | POA: Diagnosis not present

## 2021-02-27 DIAGNOSIS — E1169 Type 2 diabetes mellitus with other specified complication: Secondary | ICD-10-CM | POA: Diagnosis not present

## 2021-02-27 DIAGNOSIS — I152 Hypertension secondary to endocrine disorders: Secondary | ICD-10-CM

## 2021-02-27 DIAGNOSIS — H409 Unspecified glaucoma: Secondary | ICD-10-CM

## 2021-02-27 DIAGNOSIS — E118 Type 2 diabetes mellitus with unspecified complications: Secondary | ICD-10-CM

## 2021-02-27 DIAGNOSIS — E1139 Type 2 diabetes mellitus with other diabetic ophthalmic complication: Secondary | ICD-10-CM

## 2021-02-27 DIAGNOSIS — H469 Unspecified optic neuritis: Secondary | ICD-10-CM

## 2021-02-27 DIAGNOSIS — Z0000 Encounter for general adult medical examination without abnormal findings: Secondary | ICD-10-CM

## 2021-02-27 DIAGNOSIS — E1159 Type 2 diabetes mellitus with other circulatory complications: Secondary | ICD-10-CM | POA: Diagnosis not present

## 2021-02-27 DIAGNOSIS — C61 Malignant neoplasm of prostate: Secondary | ICD-10-CM

## 2021-02-27 DIAGNOSIS — H42 Glaucoma in diseases classified elsewhere: Secondary | ICD-10-CM

## 2021-02-27 DIAGNOSIS — C775 Secondary and unspecified malignant neoplasm of intrapelvic lymph nodes: Secondary | ICD-10-CM

## 2021-02-27 DIAGNOSIS — J301 Allergic rhinitis due to pollen: Secondary | ICD-10-CM | POA: Diagnosis not present

## 2021-02-27 DIAGNOSIS — Z23 Encounter for immunization: Secondary | ICD-10-CM | POA: Diagnosis not present

## 2021-02-27 DIAGNOSIS — D126 Benign neoplasm of colon, unspecified: Secondary | ICD-10-CM

## 2021-02-27 DIAGNOSIS — N3946 Mixed incontinence: Secondary | ICD-10-CM

## 2021-02-27 DIAGNOSIS — Z9849 Cataract extraction status, unspecified eye: Secondary | ICD-10-CM

## 2021-02-27 DIAGNOSIS — E785 Hyperlipidemia, unspecified: Secondary | ICD-10-CM

## 2021-02-27 LAB — POCT GLYCOSYLATED HEMOGLOBIN (HGB A1C): Hemoglobin A1C: 7.9 % — AB (ref 4.0–5.6)

## 2021-02-27 NOTE — Progress Notes (Signed)
[name] is a 75 y.o. male who presents for annual wellness visit,CPE and follow-up on chronic medical conditions.  He has underlying prostate cancer and is being followed by Dr. Jeffie Pollock for this.  Apparently the disease is spreading and he is scheduled in the near future for a PET scan.  He has been getting hormonal therapy for this.  He did have a DEXA scan done in 2020.  He continues on gabapentin for his peripheral neuropathy.  He also has optic neuropathy and is being followed at [facility] for all of his medical concerns.  He is on multiple medications for his neuropathy.  He has had cataract surgery and also being followed by ophthalmology for his glaucoma.  He does have a history of colonic polyp and is scheduled at the [place] for follow-up in 2023. Allergies are under good control.  He is being followed by the [place] for his diabetes.  Presently he is on metformin, glipizide and Jardiance at half dosing. Immunizations and Health Maintenance Immunization History  Administered Date(s) Administered   Fluad Quad(high Dose 65+) 02/15/2020   Influenza Split 02/04/2011, 02/04/2012, 01/30/2014   Influenza Whole 02/18/2009, 02/03/2010   Influenza, High Dose Seasonal PF 02/06/2013, 02/06/2014, 01/17/2015, 02/12/2015, 02/09/2016, 02/13/2016, 02/07/2017, 02/16/2017   Influenza,inj,Quad PF,6+ Mos 02/06/2013   Influenza-Unspecified 03/18/2006, 04/18/2007, 01/17/2008, 02/15/2009, 01/16/2010, 02/04/2011, 02/04/2012, 02/14/2018, 02/14/2018, 02/06/2019, 02/11/2019   PFIZER Comirnaty(Gray Top)Covid-19 Tri-Sucrose Vaccine 08/20/2020   PFIZER(Purple Top)SARS-COV-2 Vaccination 06/30/2019, 07/25/2019, 02/16/2020, 08/20/2020   Pneumococcal Conjugate-13 08/02/2013, 02/06/2014   Pneumococcal Polysaccharide-23 02/04/2012, 11/29/2020   Pneumococcal-Unspecified 06/20/2010   Tdap 10/16/2009, 02/03/2010, 09/07/2019   Zoster Recombinat (Shingrix) 03/25/2017, 06/02/2017   Zoster, Live 03/25/2006   Health Maintenance Due   Topic Date Due   HEMOGLOBIN A1C  08/27/2020   INFLUENZA VACCINE  12/16/2020   COVID-19 Vaccine (5 - Booster for [place] series) 12/20/2020    Last colonoscopy:  12/16/2016 Last PSA:08/11/19  level 16.40 Dentist: N/A Ophtho: 12/18/20 Exercise: walking seven days a week   Other doctors caring for patient include: [facility] , [facility], Dr. Jeffie Pollock urology  Advanced Directives: Does Patient Have a Medical Advance Directive?: Yes Type of Advance Directive: Living will, Out of facility DNR (pink MOST or yellow form) Does patient want to make changes to medical advance directive?: No - Patient declined  Depression screen:  See questionnaire below.     Depression screen [facility] 2/9 02/27/2021 02/27/2020 02/21/2019 02/18/2018 02/16/2017  Decreased Interest 0 0 0 0 0  Down, Depressed, Hopeless 0 0 0 0 0  PHQ - 2 Score 0 0 0 0 0    Fall Screen: See Questionaire below.   Fall Risk  02/27/2021 02/27/2020 02/21/2019 02/18/2018 02/16/2017  Falls in the past year? 1 1 0 - No  Number falls in past yr: 0 1 0 2 or more -  Injury with Fall? 0 0 0 No -  Risk for fall due to : Impaired vision - - Impaired vision -  Follow up Falls evaluation completed - - - -    ADL screen:  See questionnaire below.  Functional Status Survey: Is the patient deaf or have difficulty hearing?: No Does the patient have difficulty seeing, even when wearing glasses/contacts?: Yes Does the patient have difficulty concentrating, remembering, or making decisions?: No Does the patient have difficulty walking or climbing stairs?: No Does the patient have difficulty dressing or bathing?: No Does the patient have difficulty doing errands alone such as visiting a doctor's office or shopping?: No  Review of Systems  Constitutional: -, -unexpected weight change, -anorexia, -fatigue Allergy: -sneezing, -itching, -congestion Dermatology: denies changing moles, rash, lumps ENT: -runny nose, -ear pain, -sore throat,   Cardiology:  -chest pain, -palpitations, -orthopnea, Respiratory: -cough, -shortness of breath, -dyspnea on exertion, -wheezing,  Gastroenterology: -abdominal pain, -nausea, -vomiting, -diarrhea, -constipation, -dysphagia Hematology: -bleeding or bruising problems Musculoskeletal: -arthralgias, -myalgias, -joint swelling, -back pain, - Ophthalmology: -vision changes,  Urology: -dysuria, -difficulty urinating,  -urinary frequency, -urgency, incontinence Neurology: -, -numbness, , -memory loss, -falls, -dizziness    PHYSICAL EXAM:   General Appearance: Alert, cooperative, no distress, appears stated age Head: Normocephalic, without obvious abnormality, atraumatic Eyes: PERRL, conjunctiva/corneas clear, EOM's intact,  Ears: Normal TM's and external ear canals Nose: Nares normal, mucosa normal, no drainage or sinus   tenderness Throat: Lips, mucosa, and tongue normal; teeth and gums normal Neck: Supple, no lymphadenopathy, thyroid:no enlargement/tenderness/nodules; no carotid bruit or JVD Lungs: Clear to auscultation bilaterally without wheezes, rales or ronchi; respirations unlabored Heart: Regular rate and rhythm, S1 and S2 normal, no murmur, rub or gallop Abdomen: Soft, non-tender, nondistended, normoactive bowel sounds, no masses, no hepatosplenomegaly Extremities: No clubbing, cyanosis or edema Pulses: 2+ and symmetric all extremities Skin: Skin color, texture, turgor normal, no rashes or lesions Lymph nodes: Cervical, supraclavicular, and axillary nodes normal Neurologic: CNII-XII intact, normal strength, sensation and gait; reflexes 2+ and symmetric throughout   Psych: Normal mood, affect, hygiene and grooming  ASSESSMENT/PLAN: Need for COVID-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  Diabetes mellitus type 2 with complications ([place]) - Plan: POCT glycosylated hemoglobin (Hb A1C)  Hypertension associated with diabetes ([place])  Need for vaccination - Plan: Flu  Vaccine QUAD High Dose(Fluad)  Hyperlipidemia associated with type 2 diabetes mellitus (HCC)  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  I will discuss follow-up DEXA scan with Dr. Sandrea Matte.    Immunization recommendations discussed.  Colonoscopy recommendations reviewed.   Medicare Attestation I have personally reviewed: The patient's medical and social history Their use of alcohol, tobacco or illicit drugs Their current medications and supplements The patient's functional ability including ADLs,fall risks, home safety risks, cognitive, and hearing and visual impairment Diet and physical activities Evidence for depression or mood disorders  The patient's weight, height, and BMI have been recorded in the chart.  I have made referrals, counseling, and provided education to the patient based on review of the above and I have provided the patient with a written personalized care plan for preventive services.     [name], MD   02/27/2021

## 2021-03-03 ENCOUNTER — Encounter (HOSPITAL_COMMUNITY)
Admission: RE | Admit: 2021-03-03 | Discharge: 2021-03-03 | Disposition: A | Discharge status: Home / Self Care | Payer: Medicare HMO | Source: Ambulatory Visit | Attending: Urology | Admitting: Urology

## 2021-03-03 ENCOUNTER — Other Ambulatory Visit: Payer: Self-pay

## 2021-03-03 DIAGNOSIS — C61 Malignant neoplasm of prostate: Secondary | ICD-10-CM | POA: Insufficient documentation

## 2021-03-03 DIAGNOSIS — J849 Interstitial pulmonary disease, unspecified: Secondary | ICD-10-CM | POA: Diagnosis not present

## 2021-03-03 DIAGNOSIS — J432 Centrilobular emphysema: Secondary | ICD-10-CM | POA: Diagnosis not present

## 2021-03-03 DIAGNOSIS — R918 Other nonspecific abnormal finding of lung field: Secondary | ICD-10-CM | POA: Diagnosis not present

## 2021-03-03 MED ORDER — PIFLIFOLASTAT F 18 (PYLARIFY) INJECTION
9.0000 | Freq: Once | INTRAVENOUS | Status: AC
  Administered 2021-03-03: 9.7 via INTRAVENOUS

## 2021-06-03 DIAGNOSIS — C61 Malignant neoplasm of prostate: Secondary | ICD-10-CM | POA: Diagnosis not present

## 2021-06-04 ENCOUNTER — Telehealth: Payer: Self-pay | Admitting: Pharmacist

## 2021-06-04 NOTE — Chronic Care Management (AMB) (Signed)
Chronic Care Management Pharmacy Assistant   Name: [name]  MRN: [id number] DOB: 1945/07/24  Reason for Encounter: Disease State   Conditions to be addressed/monitored: HTN and DMII  Recent office visits:  02/27/21 Billy Lung, MD - Patient presented for COVID Vaccine and other concerns.  Recent consult visits:  None   Hospital visits:  None in previous 6 months  Medications: Outpatient Encounter Medications as of 06/04/2021  Medication Sig   aspirin 81 MG tablet Take 81 mg by mouth daily.   azaTHIOprine (IMURAN) 50 MG tablet Take 100 mg by mouth in the morning and at bedtime.   benzonatate (TESSALON) 100 MG capsule Take by mouth.   Brinzolamide-Brimonidine (SIMBRINZA) 1-0.2 % SUSP Place 1 drop into both eyes 3 times daily.   Calcium Carbonate-Vitamin D 600-125 MG-UNIT TABS Take 1 tablet by mouth daily.     empagliflozin (JARDIANCE) 25 MG TABS tablet Take 12.5 mg by mouth daily.   fluticasone (FLONASE) 50 MCG/ACT nasal spray Place 2 sprays into both nostrils daily as needed.   gabapentin (NEURONTIN) 600 MG tablet Take 1 tablet by mouth 2 (two) times daily.   glipiZIDE (GLUCOTROL) 5 MG tablet Take 5 mg by mouth 2 (two) times daily before a meal.   Glycerin-Polysorbate 80 (REFRESH DRY EYE THERAPY OP) Place 1 drop into both eyes as needed (DRY EYES).   ketorolac (ACULAR) 0.5 % ophthalmic solution Place 1 drop into the right eye 4 times daily.   latanoprost (XALATAN) 0.005 % ophthalmic solution Place 1 drop into the right eye nightly.   loratadine (CLARITIN) 10 MG tablet Take 10 mg by mouth daily as needed for allergies.   losartan (COZAAR) 100 MG tablet Take 100 mg by mouth daily.   metFORMIN (GLUCOPHAGE) 1000 MG tablet Take 1,000 mg by mouth 2 (two) times daily with a meal.   Multiple Vitamin (MULTIVITAMIN ADULT PO) Take by mouth.   Multiple Vitamin (MULTIVITAMIN) capsule Take by mouth.   Netarsudil Dimesylate 0.02 % SOLN INSTILL ONE DROP IN BOTH EYES DAILY    pravastatin (PRAVACHOL) 40 MG tablet Take 40 mg by mouth daily.   PSYLLIUM PO TAKE 1 TEASPOONFUL BY MOUTH EVERY DAY   timolol (TIMOPTIC) 0.5 % ophthalmic solution Place 1 drop into both eyes 2 times daily.   No facility-administered encounter medications on file as of 06/04/2021.  Recent Relevant Labs: Lab Results  Component Value Date/Time   HGBA1C 7.9 (A) 02/27/2021 10:59 AM   HGBA1C 7.6 (A) 02/27/2020 10:44 AM   MICROALBUR 14.1 02/27/2020 11:33 AM   MICROALBUR <5.0 02/13/2016 09:40 AM    Kidney Function Lab Results  Component Value Date/Time   CREATININE 1.30 (H) 02/27/2020 10:30 AM   CREATININE 1.13 02/13/2016 08:14 AM   CREATININE 1.35 (H) 02/12/2015 12:01 AM   CREATININE 1.2 02/03/2013 01:45 PM   GFRNONAA 53 (L) 02/27/2020 10:30 AM   GFRAA 62 02/27/2020 10:30 AM    Current antihyperglycemic regimen:  Glipizide 5 mg 1 tablet twice daily  Metformin 1000 mg 1 tablet twice daily Jardiance 25 mg 1/2 tablet daily What recent interventions/DTPs have been made to improve glycemic control:  Patient reports no changes Have there been any recent hospitalizations or ED visits since last visit with CPP? No Patient denies hypoglycemic symptoms, including None Patient denies hyperglycemic symptoms, including none How often are you checking your blood sugar? once daily and in the morning before eating or drinking What are your blood sugars ranging?  Fasting: Patient reports they range  no lower than 100 or higher than 130  During the week, how often does your blood glucose drop below 70? Never   Adherence Review: Is the patient currently on a STATIN medication? Yes Is the patient currently on ACE/ARB medication? Yes Does the patient have >5 day gap between last estimated fill dates? No  Reviewed chart prior to disease state call. Spoke with patient regarding BP  Recent Office Vitals: BP Readings from Last 3 Encounters:  02/27/21 130/80  02/27/20 128/82  11/23/19 114/82    Pulse Readings from Last 3 Encounters:  02/27/21 (!) 57  02/27/20 73  11/23/19 84    Wt Readings from Last 3 Encounters:  02/27/21 266 lb 9.6 oz (120.9 kg)  02/27/20 266 lb 3.2 oz (120.7 kg)  11/23/19 264 lb (119.7 kg)     Kidney Function Lab Results  Component Value Date/Time   CREATININE 1.30 (H) 02/27/2020 10:30 AM   CREATININE 1.13 02/13/2016 08:14 AM   CREATININE 1.35 (H) 02/12/2015 12:01 AM   CREATININE 1.2 02/03/2013 01:45 PM   GFRNONAA 53 (L) 02/27/2020 10:30 AM   GFRAA 62 02/27/2020 10:30 AM    BMP Latest Ref Rng & Units 02/27/2020 02/13/2016 02/12/2015  Glucose 65 - 99 mg/dL 156(H) 115(H) 82  BUN 8 - 27 mg/dL 15 13 17   Creatinine 0.76 - 1.27 mg/dL 1.30(H) 1.13 1.35(H)  BUN/Creat Ratio 10 - 24 12 - -  Sodium 134 - 144 mmol/L 141 141 139  Potassium 3.5 - 5.2 mmol/L 4.9 4.5 4.0  Chloride 96 - 106 mmol/L 106 104 105  CO2 20 - 29 mmol/L 26 25 26   Calcium 8.6 - 10.2 mg/dL 9.6 9.2 9.5    Current antihypertensive regimen:  Losartan 100 mg 1 tablet daily How often are you checking your Blood Pressure? Patient reports not checking at all. He reports he has it checked when he goes to the [facility] and it has always been good. He denies any headaches, dizziness, lightheadedness. Current home BP readings:  BP Readings from Last 3 Encounters:  02/27/21 130/80  02/27/20 128/82  11/23/19 114/82   What recent interventions/DTPs have been made by any provider to improve Blood Pressure control since last CPP Visit: Patient reports no changes Any recent hospitalizations or ED visits since last visit with CPP? No What diet changes have been made to improve Blood Pressure Control?  Patient reports his wife prepares his meals and he tries to watch sodium What exercise is being done to improve your Blood Pressure Control?  Patient reports he does walks   Adherence Review: Is the patient currently on ACE/ARB medication? Yes Does the patient have >5 day gap between last estimated  fill dates? No    Care Gaps: BP-130/80 (02/27/21)  AWV- 10/23 CCM- 3/23  Star Rating Drugs: Gl[facility] 5 [facility] Losartan 100 mg - [facility]) Metformin 500 mg -  [facility]x [facility]) Pravastatin 10 mg -  [facility] fil [facility]x [facility])   [place] Clinical Pharmacist Assistant [phone]

## 2021-06-10 DIAGNOSIS — C778 Secondary and unspecified malignant neoplasm of lymph nodes of multiple regions: Secondary | ICD-10-CM | POA: Diagnosis not present

## 2021-06-10 DIAGNOSIS — E230 Hypopituitarism: Secondary | ICD-10-CM | POA: Diagnosis not present

## 2021-06-10 DIAGNOSIS — C61 Malignant neoplasm of prostate: Secondary | ICD-10-CM | POA: Diagnosis not present

## 2021-06-10 DIAGNOSIS — N393 Stress incontinence (female) (male): Secondary | ICD-10-CM | POA: Diagnosis not present

## 2021-08-13 ENCOUNTER — Telehealth: Payer: Self-pay | Admitting: Pharmacist

## 2021-08-13 NOTE — Chronic Care Management (AMB) (Signed)
? ? ?  Chronic Care Management ?Pharmacy Assistant  ? ?Name: [name]  MRN: [id number] DOB: 16-Sep-1945 ? ?08/13/21 APPOINTMENT REMINDER ? ? ? ?Patient was reminded to have all medications, supplements and any blood glucose and blood pressure readings available for review with [name], Pharm. D, for telephone visit on 08/14/21 at 1:30. ? ? ? ?Care Gaps: ?AWV - 10/22 ?BP- 130/80 ? ?Star Rating Drug: ?Glipizide 5 mg - [facility] ?Losartan 100 mg - [facility] fil [facility]x [facility]) ?Metformin 500 mg -  [facility] fil hx [facility]) ?Pravastatin 10 mg -  [facility] fil [facility]x [facility]) ? ?  ? ? ?Medications: ?Outpatient Encounter Medications as of 08/13/2021  ?Medication Sig  ? aspirin 81 MG tablet Take 81 mg by mouth daily.  ? azaTHIOprine (IMURAN) 50 MG tablet Take 100 mg by mouth in the morning and at bedtime.  ? benzonatate (TESSALON) 100 MG capsule Take by mouth.  ? Brinzolamide-Brimonidine (SIMBRINZA) 1-0.2 % SUSP Place 1 drop into both eyes 3 times daily.  ? Calcium Carbonate-Vitamin D 600-125 MG-UNIT TABS Take 1 tablet by mouth daily.    ? empagliflozin (JARDIANCE) 25 MG TABS tablet Take 12.5 mg by mouth daily.  ? fluticasone (FLONASE) 50 MCG/ACT nasal spray Place 2 sprays into both nostrils daily as needed.  ? gabapentin (NEURONTIN) 600 MG tablet Take 1 tablet by mouth 2 (two) times daily.  ? glipiZIDE (GLUCOTROL) 5 MG tablet Take 5 mg by mouth 2 (two) times daily before a meal.  ? Glycerin-Polysorbate 80 (REFRESH DRY EYE THERAPY OP) Place 1 drop into both eyes as needed (DRY EYES).  ? ketorolac (ACULAR) 0.5 % ophthalmic solution Place 1 drop into the right eye 4 times daily.  ? latanoprost (XALATAN) 0.005 % ophthalmic solution Place 1 drop into the right eye nightly.  ? loratadine (CLARITIN) 10 MG tablet Take 10 mg by mouth daily as needed for allergies.  ? losartan (COZAAR) 100 MG tablet Take 100 mg by mouth daily.  ? metFORMIN (GLUCOPHAGE) 1000 MG tablet Take 1,000 mg by mouth 2 (two) times daily with a meal.   ? Multiple Vitamin (MULTIVITAMIN ADULT PO) Take by mouth.  ? Multiple Vitamin (MULTIVITAMIN) capsule Take by mouth.  ? Netarsudil Dimesylate 0.02 % SOLN INSTILL ONE DROP IN BOTH EYES DAILY  ? pravastatin (PRAVACHOL) 40 MG tablet Take 40 mg by mouth daily.  ? PSYLLIUM PO TAKE 1 TEASPOONFUL BY MOUTH EVERY DAY  ? timolol (TIMOPTIC) 0.5 % ophthalmic solution Place 1 drop into both eyes 2 times daily.  ? ?No facility-administered encounter medications on file as of 08/13/2021.  ? ? ? ? ?Ned Clines CMA ?Clinical Pharmacist Assistant ?[phone] ? ?

## 2021-08-14 ENCOUNTER — Ambulatory Visit (INDEPENDENT_AMBULATORY_CARE_PROVIDER_SITE_OTHER): Payer: Medicare HMO | Admitting: Pharmacist

## 2021-08-14 DIAGNOSIS — I152 Hypertension secondary to endocrine disorders: Secondary | ICD-10-CM

## 2021-08-14 DIAGNOSIS — E118 Type 2 diabetes mellitus with unspecified complications: Secondary | ICD-10-CM

## 2021-08-14 NOTE — Progress Notes (Signed)
? ?Chronic Care Management ?Pharmacy Note ? ?08/14/2021 ?Name:  [name] MRN:  [id number] DOB:  [birth year] ? ?Summary: ?BP at goal < 130/80 ?A1c not at goal < 7% ? ?Recommendations/Changes made from today's visit: ?-Recommended routine monitoring of BP at home ?-Recommended alternating times of checking blood sugars ?-Recommended trial of melatonin timed release vs immediate release ? ?Plan: ?Follow up BP and DM assessment in 2 months ?Follow up in 6 months ? ?Subjective: ?[name] is an 75 y.o. year old male who is a primary patient of [employer], Elyse Jarvis, MD.  The CCM team was consulted for assistance with disease management and care coordination needs.   ? ?Engaged with patient by telephone for follow up visit in response to provider referral for pharmacy case management and/or care coordination services.  ? ?Consent to Services:  ?The patient was given information about Chronic Care Management services, agreed to services, and gave verbal consent prior to initiation of services.  Please see initial visit note for detailed documentation.  ? ?Patient Care Team: ?[name], MD as PCP - General (Family Medicine) ?Viona Gilmore, [facility] as Pharmacist (Pharmacist) ? ?Recent office visits: ?02/27/21 [name], MD - Patient presented for annual exam and COVID Vaccine and other concerns. ? ?Recent consult visits: ?06/30/21 Dwana Melena, MD (ophthalmology): Patient presented for uveitis follow up. ? ?06/10/21 [name] - seen for malignant neoplasm of prostate and lymph node regions. Unable to access notes. ? ?06/09/21 Rupal Chiniwalla ([facility]): Unable to [place] notes. ?  ?09/17/20 [name] (Ophthalmology) - seen for Panuveitis of right eye. Procedure of eye performed and follow up in 12 weeks.  ? ?Hospital visits: ?None in previous 6 months ? ? ?Objective: ? ?Lab Results  ?Component Value Date  ? CREATININE 1.30 (H) 02/27/2020  ? BUN 15 02/27/2020  ? GFRNONAA 53 (L) 02/27/2020  ?  GFRAA 62 02/27/2020  ? NA 141 02/27/2020  ? K 4.9 02/27/2020  ? CALCIUM 9.6 02/27/2020  ? CO2 26 02/27/2020  ? GLUCOSE 156 (H) 02/27/2020  ? ? ?Lab Results  ?Component Value Date/Time  ? HGBA1C 7.9 (A) 02/27/2021 10:59 AM  ? HGBA1C 7.6 (A) 02/27/2020 10:44 AM  ? MICROALBUR 14.1 02/27/2020 11:33 AM  ? MICROALBUR <5.0 02/13/2016 09:40 AM  ?  ?Last diabetic Eye exam:  ?Lab Results  ?Component Value Date/Time  ? HMDIABEYEEXA No Retinopathy 04/06/2018 12:00 AM  ?  ?Last diabetic Foot exam: No results found for: HMDIABFOOTEX  ? ?Lab Results  ?Component Value Date  ? CHOL 139 02/27/2020  ? HDL 48 02/27/2020  ? [place] 77 02/27/2020  ? TRIG 69 02/27/2020  ? CHOLHDL 2.9 02/27/2020  ? ? ? ?  Latest Ref Rng & Units 02/27/2020  ? 10:30 AM 02/13/2016  ?  8:14 AM 02/12/2015  ? 12:01 AM  ?Hepatic Function  ?Total Protein 6.0 - 8.5 g/dL 7.1   6.9   7.1    ?Albumin 3.7 - 4.7 g/dL 4.3   4.0   4.0    ?AST 0 - 40 IU/L '23   28   21    ' ?ALT 0 - 44 IU/L '18   18   16    ' ?Alk Phosphatase 44 - 121 IU/L 81   72   79    ?Total Bilirubin 0.0 - 1.2 mg/dL 0.7   1.0   0.8    ? ? ?No results found for: TSH, FREET4 ? ? ?  Latest Ref Rng &  Units 02/27/2020  ? 10:30 AM 02/13/2016  ?  8:14 AM 02/12/2015  ? 12:01 AM  ?CBC  ?WBC 3.4 - 10.8 x10E3/uL 4.5   4.9   6.4    ?Hemoglobin 13.0 - 17.7 g/dL 12.3   14.2   14.4    ?Hematocrit 37.5 - 51.0 % 38.6   43.3   43.0    ?Platelets 150 - 450 x10E3/uL 246   215   231    ? ? ?No results found for: VD25OH ? ?Clinical ASCVD: No  ?The 10-year ASCVD risk score (Arnett DK, et al., 2019) is: 54.5% ?  Values used to calculate the score: ?    Age: 63 years ?    Sex: Male ?    Is Non-Hispanic African American: Yes ?    Diabetic: Yes ?    Tobacco smoker: Yes ?    Systolic Blood Pressure: 433 mmHg ?    Is BP treated: Yes ?    HDL Cholesterol: 50 mg/dL ?    Total Cholesterol: 140 mg/dL   ? ? ?  02/27/2021  ?  9:27 AM 02/27/2020  ?  9:39 AM 02/21/2019  ?  9:36 AM  ?Depression screen PHQ 2/9  ?Decreased Interest 0 0 0  ?Down,  Depressed, Hopeless 0 0 0  ?PHQ - 2 Score 0 0 0  ?  ? ?Social History  ? ?Tobacco Use  ?Smoking Status Former  ?Smokeless Tobacco Never  ? ?BP Readings from Last 3 Encounters:  ?02/27/21 130/80  ?02/27/20 128/82  ?11/23/19 114/82  ? ?Pulse Readings from Last 3 Encounters:  ?02/27/21 (!) 57  ?02/27/20 73  ?11/23/19 84  ? ?Wt Readings from Last 3 Encounters:  ?02/27/21 266 lb 9.6 oz (120.9 kg)  ?02/27/20 266 lb 3.2 oz (120.7 kg)  ?11/23/19 264 lb (119.7 kg)  ? ?BMI Readings from Last 3 Encounters:  ?02/27/21 33.77 kg/m?  ?02/27/20 34.18 kg/m?  ?11/23/19 32.14 kg/m?  ? ? ?Assessment/Interventions: Review of patient past medical history, allergies, medications, health status, including review of consultants reports, laboratory and other test data, was performed as part of comprehensive evaluation and provision of chronic care management services.  ? ?SDOH:  (Social Determinants of Health) assessments and interventions performed: Yes ? ? ?SDOH Screenings  ? ?Alcohol Screen: Not on file  ?Depression (PHQ2-9): Low Risk   ? PHQ-2 Score: 0  ?Financial Resource Strain: Low Risk   ? Difficulty of Paying Living Expenses: Not hard at all  ?Food Insecurity: Not on file  ?Housing: Not on file  ?Physical Activity: Not on file  ?Social Connections: Not on file  ?Stress: Not on file  ?Tobacco Use: Medium Risk  ? Smoking Tobacco Use: Former  ? Smokeless Tobacco Use: Never  ? Passive Exposure: Not on file  ?Transportation Needs: No Transportation Needs  ? Lack of Transportation (Medical): No  ? Lack of Transportation (Non-Medical): No  ? ?Patient usually gets up around 6:30 and takes his morning medications including his eye medications and then he goes for a 2 mile walk. Some days he cuts the grass instead of walking. He usually eats breakfast and then watches TV and then ready for medications again.  ? ?Patient spends a lot of time in his garden and then eats supper around 4 pm and then takes medications and then takes eye  medications again around 10pm. Patient cannot see very well right now and only has about 2% of vision in one eye. He does try to read the  newspaper. ? ?Patient's wife brings him to eye doctor's appointments. He is still driving. Patient has [employer] and he uses the pharmacy over there so he doesn't have to pay. He uses 3 pillboxes at a time for his medications. Patient doesn't miss doses of medications very often.  ? ?Patient uses diapers/pads because of his bladder cancer. Patient is not having pain right now. ? ?Patient doesn't sleep more than 3 hours per night. He doesn't feel tired during the day but does take naps during the day. He does take the [place]ill sometimes. Recommended melatonin over the counter. ? ?CCM Care Plan ? ?Allergies  ?Allergen Reactions  ? Ace Inhibitors Other (See Comments) and Swelling  ?  Angioedema ? ?Angioedema ?  ? Lisinopril Other (See Comments)  ? ? ?Medications Reviewed Today   ? ? Reviewed by Viona Gilmore, [place] (Pharmacist) on 08/14/21 at 1347  Med List Status: <None>  ? ?Medication Order Taking? Sig Documenting Provider Last Dose Status Informant  ?aspirin 81 MG tablet [id number]  Take 81 mg by mouth daily. [provider]  Active   ?azaTHIOprine (IMURAN) 50 MG tablet [id number]  Take 100 mg by mouth in the morning and at bedtime. [provider]  Active   ?benzonatate (TESSALON) 100 MG capsule [id number]  Take by mouth. [provider]  Active   ?Brinzolamide-Brimonidine (SIMBRINZA) 1-0.2 % SUSP [id number]  Place 1 drop into both eyes 3 times daily. [provider]  Active   ?Calcium Carbonate-Vitamin D 600-125 MG-UNIT TABS [id number]  Take 1 tablet by mouth daily.   [provider]  Active   ?empagliflozin (JARDIANCE) 25 MG TABS tablet [id number] Yes Take 12.5 mg by mouth daily. [provider] Taking Active   ?fluticasone (FLONASE) 50 MCG/ACT nasal spray [id number]  Place 2 sprays into both nostrils daily as needed. [provider]  Active   ?gabapentin (NEURONTIN) 600 MG tablet [id number]  Take 1 tablet by mouth 2 (two) times daily. [provider]  Active   ?glipiZIDE (GLUCOTROL) 5 MG tablet [id number] Yes Take 7.5 mg by mout

## 2021-08-15 DIAGNOSIS — E118 Type 2 diabetes mellitus with unspecified complications: Secondary | ICD-10-CM | POA: Diagnosis not present

## 2021-08-15 DIAGNOSIS — E1159 Type 2 diabetes mellitus with other circulatory complications: Secondary | ICD-10-CM | POA: Diagnosis not present

## 2021-08-15 DIAGNOSIS — I152 Hypertension secondary to endocrine disorders: Secondary | ICD-10-CM | POA: Diagnosis not present

## 2021-09-03 DIAGNOSIS — C61 Malignant neoplasm of prostate: Secondary | ICD-10-CM | POA: Diagnosis not present

## 2021-09-03 LAB — TESTOSTERONE: Testosterone: 216.5

## 2021-09-03 LAB — PSA: PSA: 2.06

## 2021-09-10 DIAGNOSIS — E230 Hypopituitarism: Secondary | ICD-10-CM | POA: Diagnosis not present

## 2021-09-10 DIAGNOSIS — C61 Malignant neoplasm of prostate: Secondary | ICD-10-CM | POA: Diagnosis not present

## 2021-09-10 DIAGNOSIS — C778 Secondary and unspecified malignant neoplasm of lymph nodes of multiple regions: Secondary | ICD-10-CM | POA: Diagnosis not present

## 2021-09-10 DIAGNOSIS — N393 Stress incontinence (female) (male): Secondary | ICD-10-CM | POA: Diagnosis not present

## 2021-09-11 ENCOUNTER — Other Ambulatory Visit (HOSPITAL_COMMUNITY): Payer: Self-pay | Admitting: Urology

## 2021-09-11 DIAGNOSIS — C61 Malignant neoplasm of prostate: Secondary | ICD-10-CM

## 2021-09-23 ENCOUNTER — Encounter (HOSPITAL_COMMUNITY)
Admission: RE | Admit: 2021-09-23 | Discharge: 2021-09-23 | Disposition: A | Discharge status: Home / Self Care | Payer: Medicare HMO | Source: Ambulatory Visit | Attending: Urology | Admitting: Urology

## 2021-09-23 DIAGNOSIS — I712 Thoracic aortic aneurysm, without rupture, unspecified: Secondary | ICD-10-CM | POA: Diagnosis not present

## 2021-09-23 DIAGNOSIS — C61 Malignant neoplasm of prostate: Secondary | ICD-10-CM | POA: Insufficient documentation

## 2021-09-23 DIAGNOSIS — D3500 Benign neoplasm of unspecified adrenal gland: Secondary | ICD-10-CM | POA: Diagnosis not present

## 2021-09-23 DIAGNOSIS — R972 Elevated prostate specific antigen [PSA]: Secondary | ICD-10-CM | POA: Diagnosis not present

## 2021-09-23 DIAGNOSIS — Z8546 Personal history of malignant neoplasm of prostate: Secondary | ICD-10-CM | POA: Diagnosis not present

## 2021-09-23 MED ORDER — PIFLIFOLASTAT F 18 (PYLARIFY) INJECTION
9.0000 | Freq: Once | INTRAVENOUS | Status: AC
Start: 2021-09-23 — End: 2021-09-23
  Administered 2021-09-23: 9.03 via INTRAVENOUS

## 2021-09-25 ENCOUNTER — Telehealth: Payer: Self-pay | Admitting: Pharmacist

## 2021-09-25 NOTE — Chronic Care Management (AMB) (Signed)
Chronic Care Management Pharmacy Assistant   Name: [name]  MRN: [id number] DOB: 30-Jan-1946  Reason for Encounter: Disease State   Conditions to be addressed/monitored: HTN and DMII  Recent office visits:  None  Recent consult visits:  10/02/21 [name] ([facility] - Patient presented for pain in right hip. X Ray Taken of hip and pelvis. No medication changes.  09/19/21 Arellano,Billy R (Dental) - Patient presented for Dentures of complete Mandible. No medication changes. Dentures given to Patient.  08/15/21 [name] ([place]) - Patient presented to [facility], no visit details available.  Hospital visits:  None in previous 6 months  Medications: Outpatient Encounter Medications as of 09/25/2021  Medication Sig   aspirin 81 MG tablet Take 81 mg by mouth daily.   azaTHIOprine (IMURAN) 50 MG tablet Take 100 mg by mouth in the morning and at bedtime.   benzonatate (TESSALON) 100 MG capsule Take by mouth.   Brinzolamide-Brimonidine (SIMBRINZA) 1-0.2 % SUSP Place 1 drop into both eyes 3 times daily.   Calcium Carbonate-Vitamin D 600-125 MG-UNIT TABS Take 1 tablet by mouth daily.     empagliflozin (JARDIANCE) 25 MG TABS tablet Take 12.5 mg by mouth daily.   fluticasone (FLONASE) 50 MCG/ACT nasal spray Place 2 sprays into both nostrils daily as needed.   gabapentin (NEURONTIN) 600 MG tablet Take 1 tablet by mouth 2 (two) times daily.   glipiZIDE (GLUCOTROL) 5 MG tablet Take 7.5 mg by mouth 2 (two) times daily before Arellano meal.   Glycerin-Polysorbate 80 (REFRESH DRY EYE THERAPY OP) Place 1 drop into both eyes as needed (DRY EYES).   ketorolac (ACULAR) 0.5 % ophthalmic solution Place 1 drop into the right eye 4 times daily.   latanoprost (XALATAN) 0.005 % ophthalmic solution Place 1 drop into the right eye nightly.   loratadine (CLARITIN) 10 MG tablet Take 10 mg by mouth daily as needed for allergies.   losartan (COZAAR) 100 MG tablet Take 100 mg by mouth daily.   melatonin 3 MG TABS  tablet Take 3 mg by mouth at bedtime.   metFORMIN (GLUCOPHAGE) 1000 MG tablet Take 1,000 mg by mouth 2 (two) times daily with Arellano meal.   Multiple Vitamin (MULTIVITAMIN ADULT PO) Take by mouth.   Netarsudil Dimesylate 0.02 % SOLN INSTILL ONE DROP IN BOTH EYES DAILY   pravastatin (PRAVACHOL) 40 MG tablet Take 40 mg by mouth daily.   PSYLLIUM PO TAKE 1 TEASPOONFUL BY MOUTH EVERY DAY   timolol (TIMOPTIC) 0.5 % ophthalmic solution Place 1 drop into both eyes 2 times daily.   No facility-administered encounter medications on file as of 09/25/2021.  Recent Relevant Labs: Lab Results  Component Value Date/Time   HGBA1C 7.9 (Arellano) 02/27/2021 10:59 AM   HGBA1C 7.6 (Arellano) 02/27/2020 10:44 AM   MICROALBUR 14.1 02/27/2020 11:33 AM   MICROALBUR <5.0 02/13/2016 09:40 AM    Kidney Function Lab Results  Component Value Date/Time   CREATININE 1.30 (H) 02/27/2020 10:30 AM   CREATININE 1.13 02/13/2016 08:14 AM   CREATININE 1.35 (H) 02/12/2015 12:01 AM   CREATININE 1.2 02/03/2013 01:45 PM   GFRNONAA 53 (L) 02/27/2020 10:30 AM   GFRAA 62 02/27/2020 10:30 AM    Current antihyperglycemic regimen:  Glipizide 5 mg 1.5 tablets twice daily - Appropriate, Query effective, Safe, Accessible Metformin 1000 mg 1 tablet twice daily - Appropriate, Query effective, Safe, Accessible Jardiance 25 mg 1/2 tablet daily - Appropriate, Query effective, Safe, Accessible   Recent Office Vitals: BP Readings from  Last 3 Encounters:  02/27/21 130/80  02/27/20 128/82  11/23/19 114/82   Pulse Readings from Last 3 Encounters:  02/27/21 (!) 57  02/27/20 73  11/23/19 84    Wt Readings from Last 3 Encounters:  02/27/21 266 lb 9.6 oz (120.9 kg)  02/27/20 266 lb 3.2 oz (120.7 kg)  11/23/19 264 lb (119.7 kg)     Kidney Function Lab Results  Component Value Date/Time   CREATININE 1.30 (H) 02/27/2020 10:30 AM   CREATININE 1.13 02/13/2016 08:14 AM   CREATININE 1.35 (H) 02/12/2015 12:01 AM   CREATININE 1.2 02/03/2013 01:45 PM    GFRNONAA 53 (L) 02/27/2020 10:30 AM   GFRAA 62 02/27/2020 10:30 AM       Latest Ref Rng & Units 02/27/2020   10:30 AM 02/13/2016    8:14 AM 02/12/2015   12:01 AM  BMP  Glucose 65 - 99 mg/dL 156   115   82    BUN 8 - 27 mg/dL '15   13   17    '$ Creatinine 0.76 - 1.27 mg/dL 1.30   1.13   1.35    BUN/Creat Ratio 10 - 24 12      Sodium 134 - 144 mmol/L 141   141   139    Potassium 3.5 - 5.2 mmol/L 4.9   4.5   4.0    Chloride 96 - 106 mmol/L 106   104   105    CO2 20 - 29 mmol/L '26   25   26    '$ Calcium 8.6 - 10.2 mg/dL 9.6   9.2   9.5      Current antihypertensive regimen:  Losartan 100 mg 1 tablet daily - Appropriate, Query      Care Gaps: HGB A1C - Overdue BP- 128/77 ( 10/02/21) AWV- 10/22 CCM- 9/23 Lab Results  Component Value Date   HGBA1C 7.9 (Arellano) 02/27/2021    Star Rating Drugs: Glipizide 5 mg - [facility]) Losartan 100 mg - No fil hx ([facility]) Metformin 500 mg -  No fil hx ([facility]) Pravastatin 10 mg -  No fil hx ([facility])    [place] Pharmacist Assistant [phone]

## 2021-10-07 ENCOUNTER — Telehealth: Payer: Self-pay | Admitting: Radiation Oncology

## 2021-10-07 DIAGNOSIS — N50812 Left testicular pain: Secondary | ICD-10-CM | POA: Diagnosis not present

## 2021-10-07 DIAGNOSIS — N50811 Right testicular pain: Secondary | ICD-10-CM | POA: Diagnosis not present

## 2021-10-07 DIAGNOSIS — C778 Secondary and unspecified malignant neoplasm of lymph nodes of multiple regions: Secondary | ICD-10-CM | POA: Diagnosis not present

## 2021-10-07 DIAGNOSIS — C61 Malignant neoplasm of prostate: Secondary | ICD-10-CM | POA: Diagnosis not present

## 2021-10-07 NOTE — Telephone Encounter (Signed)
5/23 @ 3:53 pm Left message with pt's wife for pt to call our office to be schedule for consult with Dr. Tammi Klippel.

## 2021-10-16 NOTE — Progress Notes (Signed)
GU Location of Tumor / Histology: Metastatic Prostate Ca recurrence     NM PET (PSMA) 09/23/2021 Dr. Jeffie Pollock CLINICAL DATA:  History of prostate cancer. Prior prostatectomy.  Increased PSA level. Most recent 2.1.  COMPARISON:  03/03/2021   FINDINGS:  NECK No radiotracer activity in neck lymph nodes. Incidental CT finding: No cervical adenopathy. Bilateral carotid atherosclerosis.   CHEST Left-sided mediastinal/AP window node measures 9 mm and a S.U.V. max of 4.1 on 87/4. Similar in size and a S.U.V. max of 3.3 on the prior exam.   Just caudal to this, a left mediastinal node measures 7 mm and a S.U.V. max of 4.1 on 94/4. Similar in size and not tracer avid on the prior exam.   Incidental CT finding: Mild cardiomegaly. Aortic atherosclerosis.  Ascending aortic dilatation at 4.7 cm, similar. Centrilobular and paraseptal emphysema.   ABDOMEN/PELVIS Prostate: Tracer affinity within the prostatectomy bed is slightly eccentric left at a S.U.V. max of 22.9 on 210/4. No dominant mass in this area, with soft tissue measuring maximally 1.0 cm. Compare a S.U.V. max of 7.0 on the prior exam.   Lymph nodes: No abnormal radiotracer accumulation within pelvic or abdominal nodes.   Liver: No evidence of liver metastasis   Incidental CT finding: Mild hepatic steatosis. 2.0 cm left adrenal low-density nodule is consistent with an adenoma. Normal non contrast appearance of the spleen, stomach, pancreas, gallbladder, right adrenal gland, kidneys. Decompressed urinary bladder. No significant free fluid. Abdominal aortic atherosclerosis.   SKELETON   No focal  activity to suggest skeletal metastasis.   IMPRESSION: 1. Increased tracer affinity within the prostatectomy bed, suspicious for recurrent disease. 2. No tracer avid abdominopelvic nodes. 3. Small increasingly tracer avid mediastinal nodes are indeterminate for reactive versus metastatic etiologies. Isolated thoracic nodal metastasis would be  somewhat atypical. 4. Incidental findings, including: Left adrenal adenoma. Hepatic steatosis. Emphysema (ICD10-J43.9). 5. Ascending aortic aneurysm, on the order of 4.7 cm. Recommend semi-annual imaging followup by CTA or MRA and referral to cardiothoracic surgery if not already obtained. This recommendation follows 2010 ACCF/AHA/AATS/ACR/ASA/SCA/SCAI/SIR/STS/SVM Guidelines for the Diagnosis and Management of Patients With Thoracic Aortic Disease. Circulation. 2010; 121: W102-V253. Aortic aneurysm NOS (ICD10-I71.9)   NM PET (PSMA) 03/03/2021 Dr. Jeffie Pollock CLINICAL DATA:  Prostate carcinoma with biochemical recurrence.  FINDINGS:  NECK  No radiotracer activity in neck lymph nodes. Incidental CT finding: None   CHEST No radiotracer accumulation within mediastinal or hilar lymph nodes.  No suspicious pulmonary nodules on the CT scan.   Incidental CT finding: Interstitial thickening in the lungs unchanged from prior. Centrilobular emphysema the upper lobes. Mild ground-glass opacities are new from prior.   ABDOMEN/PELVIS Prostate: No focal activity in prostatectomy bed.   Lymph nodes: Interval resolution radiotracer activity associated with the LEFT iliac lymph nodes and LEFT periaortic retroperitoneal nodes. No residual activity at these nodal stations.   Additionally, the nodes have regressed in size. For example lymph node LEFT aorta at the level the bifurcation measuring 13 mm on comparison exam is no longer measurable. LEFT common iliac node measuring 11 mm on prior is no longer identifiable.   Liver: No evidence of liver metastasis   Incidental CT finding: None   SKELETON No focal  activity to suggest skeletal metastasis.   IMPRESSION:  1. Resolution radiotracer activity associated with the LEFT iliac lymph nodes and LEFT periaortic retroperitoneal lymph nodes. 2. No evidence of PSMA positive prostate cancer metastasis within the prostate bed, lymph nodes, visceral organs or  skeleton. 3. Increase  in ground-glass opacities in lungs  and emphysema.    Past/Anticipated interventions by urology, if any: NA  Past/Anticipated interventions by medical oncology, if any: NA  Weight changes, if any:  Trying to lose weight, eating healthy and exercise.  15 lb difference over seven months.  IPSS: 3 SHIM:  5  Bowel/Bladder complaints, if any:  Constipation  Nausea/Vomiting, if any: No  Pain issues, if any:  0/10   SAFETY ISSUES: Prior radiation? Radical Prostatectomy (2006) radiation completed on 12/24/20228.  Short course ADT with firmagon followed by Eligard 45 mg and then SBRT in 11/2019 for nodal recurrence. Pacemaker/ICD? No Possible current pregnancy? Male  Is the patient on methotrexate? No  Current Complaints / other details:

## 2021-10-21 ENCOUNTER — Other Ambulatory Visit: Payer: Self-pay

## 2021-10-21 ENCOUNTER — Ambulatory Visit
Admission: RE | Admit: 2021-10-21 | Discharge: 2021-10-21 | Disposition: A | Discharge status: Home / Self Care | Payer: Medicare HMO | Source: Ambulatory Visit | Attending: Radiation Oncology | Admitting: Radiation Oncology

## 2021-10-21 VITALS — BP 122/92 | HR 87 | Temp 96.7°F | Resp 18 | Ht 74.5 in | Wt 263.1 lb

## 2021-10-21 DIAGNOSIS — K0262 Dental caries on smooth surface penetrating into dentin: Secondary | ICD-10-CM | POA: Insufficient documentation

## 2021-10-21 DIAGNOSIS — C61 Malignant neoplasm of prostate: Secondary | ICD-10-CM | POA: Insufficient documentation

## 2021-10-21 DIAGNOSIS — H04123 Dry eye syndrome of bilateral lacrimal glands: Secondary | ICD-10-CM | POA: Insufficient documentation

## 2021-10-21 DIAGNOSIS — H209 Unspecified iridocyclitis: Secondary | ICD-10-CM | POA: Insufficient documentation

## 2021-10-21 DIAGNOSIS — Z79899 Other long term (current) drug therapy: Secondary | ICD-10-CM | POA: Insufficient documentation

## 2021-10-21 DIAGNOSIS — Z7189 Other specified counseling: Secondary | ICD-10-CM | POA: Insufficient documentation

## 2021-10-21 DIAGNOSIS — I6523 Occlusion and stenosis of bilateral carotid arteries: Secondary | ICD-10-CM | POA: Insufficient documentation

## 2021-10-21 DIAGNOSIS — R0600 Dyspnea, unspecified: Secondary | ICD-10-CM | POA: Insufficient documentation

## 2021-10-21 DIAGNOSIS — E119 Type 2 diabetes mellitus without complications: Secondary | ICD-10-CM | POA: Diagnosis not present

## 2021-10-21 DIAGNOSIS — Z87891 Personal history of nicotine dependence: Secondary | ICD-10-CM | POA: Insufficient documentation

## 2021-10-21 DIAGNOSIS — I10 Essential (primary) hypertension: Secondary | ICD-10-CM | POA: Diagnosis not present

## 2021-10-21 DIAGNOSIS — IMO0002 Reserved for concepts with insufficient information to code with codable children: Secondary | ICD-10-CM | POA: Insufficient documentation

## 2021-10-21 DIAGNOSIS — E785 Hyperlipidemia, unspecified: Secondary | ICD-10-CM | POA: Diagnosis not present

## 2021-10-21 DIAGNOSIS — F1120 Opioid dependence, uncomplicated: Secondary | ICD-10-CM | POA: Insufficient documentation

## 2021-10-21 DIAGNOSIS — Z961 Presence of intraocular lens: Secondary | ICD-10-CM | POA: Insufficient documentation

## 2021-10-21 DIAGNOSIS — Z191 Hormone sensitive malignancy status: Secondary | ICD-10-CM | POA: Diagnosis not present

## 2021-10-21 DIAGNOSIS — L603 Nail dystrophy: Secondary | ICD-10-CM | POA: Insufficient documentation

## 2021-10-21 DIAGNOSIS — I7121 Aneurysm of the ascending aorta, without rupture: Secondary | ICD-10-CM | POA: Diagnosis not present

## 2021-10-21 DIAGNOSIS — M79671 Pain in right foot: Secondary | ICD-10-CM | POA: Insufficient documentation

## 2021-10-21 DIAGNOSIS — M7732 Calcaneal spur, left foot: Secondary | ICD-10-CM | POA: Insufficient documentation

## 2021-10-21 DIAGNOSIS — K08131 Complete loss of teeth due to caries, class I: Secondary | ICD-10-CM | POA: Insufficient documentation

## 2021-10-21 DIAGNOSIS — M109 Gout, unspecified: Secondary | ICD-10-CM | POA: Insufficient documentation

## 2021-10-21 DIAGNOSIS — H35359 Cystoid macular degeneration, unspecified eye: Secondary | ICD-10-CM | POA: Insufficient documentation

## 2021-10-21 DIAGNOSIS — G4700 Insomnia, unspecified: Secondary | ICD-10-CM | POA: Insufficient documentation

## 2021-10-21 DIAGNOSIS — C771 Secondary and unspecified malignant neoplasm of intrathoracic lymph nodes: Secondary | ICD-10-CM | POA: Diagnosis not present

## 2021-10-21 NOTE — Progress Notes (Signed)
Radiation Oncology         [phone] ________________________________  Outpatient Re-Consultation   Name: [name] MRN: [id number]  Date: 10/21/2021  DOB: Aug 17, 1945  [id number], Elyse Jarvis, MD  [name], MD   REFERRING PHYSICIAN: [name], MD  DIAGNOSIS: 76 y.o. 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    ICD-10-CM   1. Malignant neoplasm of prostate ([place])  C61     2. Recurrent prostate adenocarcinoma ([place])  C61     3. 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 male with a diagnosis of prostate cancer. He was initially diagnosed with Gleason 4+4 prostate cancer on biopsy in 02/2005.  He elected to proceed with surgery and had a radical retropubic prostatectomy performed by Dr. Jeffie Pollock on 04/16/2005. Pathology from the procedure revealed Gleason 4+3 prostatic adenocarcinoma involving both lobes of the prostate and the right seminal vesicle with negative margins. All four pelvic lymph nodes were negative (0/4).  He developed biochemical recurrence in 2008 and was referred to Dr. Valere Dross in 01/2007 for consideration of salvage radiotherapy which he completed on 05/11/2007 and on review of the patient's prior radiation records, his pelvic lymph nodes were not treated at that time.  Since that time, his PSA has remained elevated with some fluctuation between 8.5 - 10 over the years, tipping over 10 for the first time back in 11/2017. It increased to 14.3 in 12/2018 and further to 16.4 in 03/2019. This prompted a CT A/P on 04/28/2019 showing stable-appearing borderline enlarged retroperitoneal lymph nodes, similar since 2017, with a single left external iliac lymph node slightly larger. There was no pelvic mass or pelvic adenopathy and no findings to suggest solid organ metastatic disease or  osseous metastatic disease. A bone scan performed the same day showed a very subtle focus of activity along left border of sternum. Subsequent x-ray of the sternum was negative for metastatic disease.  His PSA increased further to 23.7 on 08/11/2019. This prompted further evaluation with an Axumin PET scan which was performed on 08/24/2019 and demonstrated intense radiotracer activity within the chain of lymph nodes extending from the left common iliac artery to the left periaortic nodal station. There was no evidence of visceral or skeletal metastases.   He received ADT with firmagon on 10/17/2019 and subsequently received Eligard on 11/17/2019 concurrent with SBRT to the involved lymph nodes 11/13/2019 through 11/23/2019. His PSA responded well, and reached a nadir of 0.19 in 07/2020. His restaging PSMA scan on 03/03/2021 showed resolution of the involved lymph nodes with no evidence of PSMA-positive metastasis.  More recently, his PSA has begun to increase again, up to 2.06 in 08/2021. This prompted further evaluation with a repeat PSMA PET scan performed on 09/23/2021 showing increased tracer affinity within the prostatectomy bed but no tracer-avid abdominopelvic nodes.  There were some small, increasingly tracer-avid mediastinal nodes, which would be atypical for isolated metastasis but concerning in his case given his prior history of oligometastatic disease in the pelvic and abdominal lymph nodes.  The patient reviewed the imaging and lab results with his urologist and he has kindly been referred today for discussion of potential radiation treatment options.   PREVIOUS RADIATION THERAPY: Yes  11/13/19 - 11/23/19: The involved abdominopelvic lymph nodes were treated to 50 Gy in 5 fractions of  10 Gy; concurrent with ADT  02/2007-05/11/2007: Prostate Fossa (Dr. Valere Dross) The patient's largest fields covered only the prostate fossa and not the nodes as displayed below         PAST MEDICAL HISTORY:  Past  Medical History:  Diagnosis Date   Arthritis    Diabetes mellitus    Diverticulosis    Dyslipidemia    ED (erectile dysfunction)    Hemorrhoids    Hypertension    Prostate cancer ([place])       PAST SURGICAL HISTORY: Past Surgical History:  Procedure Laterality Date   COLONOSCOPY  2008   Dr.hung   PROSTATECTOMY     TRABECULECTOMY Right    tubla adenoma N/A 12/2016    FAMILY HISTORY:  Family History  Problem Relation Age of Onset   Diabetes Mother    Diabetes Sister    Diabetes Brother    Diabetes Maternal Aunt    Diabetes Maternal Uncle    Diabetes Maternal Grandmother    Diabetes Maternal Grandfather    Breast cancer Neg Hx    Colon cancer Neg Hx    Prostate cancer Neg Hx    Pancreatic cancer Neg Hx     SOCIAL HISTORY:  Social History   Socioeconomic History   Marital status: Married    Spouse name: Not on file   Number of children: Not on file   Years of education: Not on file   Highest education level: Not on file  Occupational History   Not on file  Tobacco Use   Smoking status: Former   Smokeless tobacco: Never  Vaping Use   Vaping Use: Never used  Substance and Sexual Activity   Alcohol use: No   Drug use: No   Sexual activity: Not Currently  Other Topics Concern   Not on file  Social History Narrative   Not on file   Social Determinants of Health   Financial Resource Strain: Low Risk    Difficulty of Paying Living Expenses: Not hard at all  Food Insecurity: Not on file  Transportation Needs: No Transportation Needs   Lack of Transportation (Medical): No   Lack of Transportation (Non-Medical): No  Physical Activity: Not on file  Stress: Not on file  Social Connections: Not on file  Intimate Partner Violence: Not on file    ALLERGIES: Ace inhibitors and Lisinopril  MEDICATIONS:  Current Outpatient Medications  Medication Sig Dispense Refill   acetaminophen (TYLENOL) 325 MG tablet TAKE TWO TABLETS BY MOUTH FOUR TIMES A DAY AS NEEDED  FOR PAIN     carboxymethylcellulose (REFRESH PLUS) 0.5 % SOLN INSTILL 1 DROP IN BOTH EYES FOUR TIMES A DAY --  TO REPLACE CARBOXYMETHYLCELLULOSE 1% OPH GEL 0.4ML (PRESERVATIVE FREE)  WHILE IT IS ON LONG TERM MANUFACTURER BACKORDER AND IS CURRENTLY  UNAVAILABLE --  TO REPLACE CARBOXYMETHYLCELLULOSE 1% OPH GEL 0.4ML (PRESERVATIVE FREE)   WHILE IT IS ON LONG TERM MANUFACTURER BACKORDER AND IS CURRENTLY   UNAVAILABLE     cyclobenzaprine (FLEXERIL) 10 MG tablet TAKE ONE-HALF TABLET BY MOUTH THREE TIMES A DAY AS NEEDED BACK PAIN (MAY CAUSE DROWSINESS)     ketorolac (ACULAR) 0.5 % ophthalmic solution Place 1 drop into the right eye 4 times daily.     Latanoprostene Bunod 0.024 % SOLN INSTILL 1 DROP IN BOTH EYES AT BEDTIME     lidocaine (LIDODERM) 5 % APPLY 1 PATCH TO SKIN ONCE DAILY (APPLY FOR 12 HOURS, THEN REMOVE FOR 12 HOURS)     Melatonin 10  MG CAPS TAKE 1 TABLET/CAPSULE ('3MG'$ ) BY MOUTH AT BEDTIME SLEEP     timolol (TIMOPTIC) 0.5 % ophthalmic solution Place 1 drop into both eyes 2 times daily.     Aspirin 81 MG CAPS Aspirin 81 mg     aspirin 81 MG tablet Take 81 mg by mouth daily.     azaTHIOprine (IMURAN) 50 MG tablet Take 100 mg by mouth in the morning and at bedtime.     benazepril-hydrochlorthiazide (LOTENSIN HCT) 10-12.5 MG tablet HCTZ     benzonatate (TESSALON) 100 MG capsule Take by mouth.     Brinzolamide-Brimonidine (SIMBRINZA) 1-0.2 % SUSP Place 1 drop into both eyes 3 times daily.     Calcium Carbonate-Vitamin D 600-125 MG-UNIT TABS Take 1 tablet by mouth daily.       empagliflozin (JARDIANCE) 25 MG TABS tablet Take 12.5 mg by mouth daily.     fluticasone (FLONASE) 50 MCG/ACT nasal spray Place 2 sprays into both nostrils daily as needed.     gabapentin (NEURONTIN) 600 MG tablet Take 1 tablet by mouth 2 (two) times daily.     glipiZIDE (GLUCOTROL) 5 MG tablet Take 7.5 mg by mouth 2 (two) times daily before a meal.     Glycerin-Polysorbate 80 (REFRESH DRY EYE THERAPY OP) Place 1 drop into  both eyes as needed (DRY EYES).     ketorolac (ACULAR) 0.5 % ophthalmic solution Place 1 drop into the right eye 4 times daily.     latanoprost (XALATAN) 0.005 % ophthalmic solution Place 1 drop into the right eye nightly.     loratadine (CLARITIN) 10 MG tablet Take 10 mg by mouth daily as needed for allergies.     losartan (COZAAR) 100 MG tablet Take 100 mg by mouth daily.     melatonin 3 MG TABS tablet Take 3 mg by mouth at bedtime.     METFORMIN & DIET MANAGE PROD PO Metformin     metFORMIN (GLUCOPHAGE) 1000 MG tablet Take 1,000 mg by mouth 2 (two) times daily with a meal.     Multiple Vitamin (MULTIVITAMIN ADULT PO) Take by mouth.     Netarsudil Dimesylate 0.02 % SOLN INSTILL ONE DROP IN BOTH EYES DAILY     pravastatin (PRAVACHOL) 20 MG tablet Pravachol     pravastatin (PRAVACHOL) 40 MG tablet Take 40 mg by mouth daily.     PSYLLIUM PO TAKE 1 TEASPOONFUL BY MOUTH EVERY DAY     timolol (TIMOPTIC) 0.5 % ophthalmic solution Place 1 drop into both eyes 2 times daily.     No current facility-administered medications for this encounter.    REVIEW OF SYSTEMS:  On review of systems, the patient reports that he is doing well overall. He denies any chest pain, shortness of breath, cough, fevers, chills, night sweats, unintended weight changes. He notes intended weight loss of 15 lbs over seven months with diet and exercise. He denies any bowel disturbances, and denies abdominal pain, nausea or vomiting. He denies any new musculoskeletal or joint aches or pains. His IPSS is 3, indicating mild urinary symptoms. A complete review of systems is obtained and is otherwise negative.    PHYSICAL EXAM:  Wt Readings from Last 3 Encounters:  10/21/21 263 lb 2 oz (119.4 kg)  02/27/21 266 lb 9.6 oz (120.9 kg)  02/27/20 266 lb 3.2 oz (120.7 kg)   Temp Readings from Last 3 Encounters:  10/21/21 (!) 96.7 F (35.9 C) (Temporal)  02/27/21 (!) 97.3 F (36.3 C)  02/27/20 Marland Kitchen)  96.7 F (35.9 C)   BP Readings  from Last 3 Encounters:  10/21/21 (!) 122/92  02/27/21 130/80  02/27/20 128/82   Pulse Readings from Last 3 Encounters:  10/21/21 87  02/27/21 (!) 57  02/27/20 73   Pain Assessment Pain Score: 0-No pain/10  In general this is a well appearing African-American male in no acute distress.  He's alert and oriented x4 and appropriate throughout the examination. Cardiopulmonary assessment is negative for acute distress and he exhibits normal effort.   KPS = 90  100 - Normal; no complaints; no evidence of disease. 90   - Able to carry on normal activity; minor signs or symptoms of disease. 80   - Normal activity with effort; some signs or symptoms of disease. 87   - Cares for self; unable to carry on normal activity or to do active work. 60   - Requires occasional assistance, but is able to care for most of his personal needs. 50   - Requires considerable assistance and frequent medical care. 51   - Disabled; requires special care and assistance. 88   - Severely disabled; hospital admission is indicated although death not imminent. 33   - Very sick; hospital admission necessary; active supportive treatment necessary. 10   - Moribund; fatal processes progressing rapidly. 0     - Dead  Karnofsky DA, Abelmann [place], Craver LS and Burchenal JH [phone]) The use of the nitrogen mustards in the palliative treatment of carcinoma: with particular reference to bronchogenic carcinoma Cancer 1 634-56  LABORATORY DATA:  Lab Results  Component Value Date   WBC 4.5 02/27/2020   HGB 12.3 (L) 02/27/2020   HCT 38.6 02/27/2020   MCV 90 02/27/2020   PLT 246 02/27/2020   Lab Results  Component Value Date   NA 141 02/27/2020   K 4.9 02/27/2020   CL 106 02/27/2020   CO2 26 02/27/2020   Lab Results  Component Value Date   ALT 18 02/27/2020   AST 23 02/27/2020   ALKPHOS 81 02/27/2020   BILITOT 0.7 02/27/2020     RADIOGRAPHY: NM PET (PSMA) SKULL TO MID THIGH  Result Date: 09/25/2021 CLINICAL DATA:   History of prostate cancer. Prior prostatectomy. Increased PSA level. Most recent 2.1. EXAM: NUCLEAR MEDICINE PET SKULL BASE TO THIGH TECHNIQUE: 9.0 mCi F18 Piflufolastat (Pylarify) was injected intravenously. Full-ring PET imaging was performed from the skull base to thigh after the radiotracer. CT data was obtained and used for attenuation correction and anatomic localization. COMPARISON:  03/03/2021 FINDINGS: NECK No radiotracer activity in neck lymph nodes. Incidental CT finding: No cervical adenopathy. Bilateral carotid atherosclerosis. CHEST Left-sided mediastinal/AP window node measures 9 mm and a S.U.V. max of 4.1 on 87/4. Similar in size and a S.U.V. max of 3.3 on the prior exam. Just caudal to this, a left mediastinal node measures 7 mm and a S.U.V. max of 4.1 on 94/4. Similar in size and not tracer avid on the prior exam. Incidental CT finding: Mild cardiomegaly. Aortic atherosclerosis. Ascending aortic dilatation at 4.7 cm, similar. Centrilobular and paraseptal emphysema. ABDOMEN/PELVIS Prostate: Tracer affinity within the prostatectomy bed is slightly eccentric left at a S.U.V. max of 22.9 on 210/4. No dominant mass in this area, with soft tissue measuring maximally 1.0 cm. Compare a S.U.V. max of 7.0 on the prior exam. Lymph nodes: No abnormal radiotracer accumulation within pelvic or abdominal nodes. Liver: No evidence of liver metastasis Incidental CT finding: Mild hepatic steatosis. 2.0 cm left adrenal low-density nodule  is consistent with an adenoma. Normal noncontrast appearance of the spleen, stomach, pancreas, gallbladder, right adrenal gland, kidneys. Decompressed urinary bladder. No significant free fluid. Abdominal aortic atherosclerosis. SKELETON No focal  activity to suggest skeletal metastasis. IMPRESSION: 1. Increased tracer affinity within the prostatectomy bed, suspicious for recurrent disease. 2. No tracer avid abdominopelvic nodes. 3. Small increasingly tracer avid mediastinal nodes  are indeterminate for reactive versus metastatic etiologies. Isolated thoracic nodal metastasis would be somewhat atypical. 4. Incidental findings, including: Left adrenal adenoma. Hepatic steatosis. Emphysema (ICD10-J43.9). 5. Ascending aortic aneurysm, on the order of 4.7 cm. Recommend semi-annual imaging followup by CTA or MRA and referral to cardiothoracic surgery if not already obtained. This recommendation follows 2010 ACCF/AHA/AATS/ACR/ASA/SCA/SCAI/SIR/STS/SVM Guidelines for the Diagnosis and Management of Patients With Thoracic Aortic Disease. Circulation. 2010; 121: D322-G254. Aortic aneurysm NOS (ICD10-I71.9) Electronically Signed   By: Abigail Miyamoto M.D.   On: 09/25/2021 12:29       IMPRESSION/PLAN:  1. 76 y.o. gentleman with a recurrence of prostate cancer s/p RALP in 03/2005 and salvage fossa XRT in 2008 for Stage pT3b Gleason 4+3 prostatic adenocarcinoma. We discussed the patient's workup and outlined the nature of recurrent prostate cancer in this setting. The patient is familiar with radiation from his prior salvage radiation therapy to the prostate fossa with Dr. Valere Dross in 2008 and more recently SBRT to the Susan Moore tracer-avid lymph nodes in 2021. The recommendation is for a 10 fraction course of SBRT to the prostate fossa and PET positive mediastinal nodes. We discussed and outlined the risks, benefits, short and long-term effects associated with radiotherapy in this setting. He was encouraged to ask questions, which were answered to his stated satisfacion.  At the end of the conversation, the patient is interested in moving forward with the recommended 10 fraction course of SBRT to the prostate fossa and PET positive mediastinal nodes.  He has freely signed written consent to proceed today in the office and a copy of this document will be placed in his medical record.  We will share our discussion with Dr. Jeffie Pollock and proceed with coordinating treatment.  He will be scheduled for CT SIM/treatment  planning later this week, in anticipation of beginning his treatments in the very near future.  We enjoyed meeting with him again today and look forward to continuing to participate in his care.  He knows that he is welcome to call at anytime in the interim with any questions or concerns.   We personally spent 60 minutes in this encounter including chart review, reviewing radiological studies, meeting face-to-face with the patient, entering orders and completing documentation.   Nicholos Johns, PA-C    Tyler Pita, MD  [facility] Direct Dial: [phone]  Fax: [phone] [email or web address]  Skype  [email or web address]   This document serves as a record of services personally performed by Tyler Pita, MD and Freeman Caldron, PA-C. It was created on their behalf by Wilburn Mylar, a trained medical scribe. The creation of this record is based on the scribe's personal observations and the provider's statements to them. This document has been checked and approved by the attending provider.

## 2021-10-24 ENCOUNTER — Encounter: Payer: Self-pay | Admitting: Urology

## 2021-10-24 NOTE — Progress Notes (Signed)
Patient is scheduled for CT [facility] 10/27/21 and will get ST-ADT 10/28/21 in preparation to begin the 10 fraction course of SBRT to the prostate fossa and PET positive mediastinal nodes.  Nicholos Johns, MMS, PA-C [facility] [place]: [phone]  Fax: [phone]

## 2021-10-27 ENCOUNTER — Ambulatory Visit
Admission: RE | Admit: 2021-10-27 | Discharge: 2021-10-27 | Disposition: A | Discharge status: Home / Self Care | Payer: Medicare HMO | Source: Ambulatory Visit | Attending: Radiation Oncology | Admitting: Radiation Oncology

## 2021-10-27 ENCOUNTER — Other Ambulatory Visit: Payer: Self-pay

## 2021-10-27 DIAGNOSIS — Z191 Hormone sensitive malignancy status: Secondary | ICD-10-CM | POA: Diagnosis not present

## 2021-10-27 DIAGNOSIS — C61 Malignant neoplasm of prostate: Secondary | ICD-10-CM | POA: Insufficient documentation

## 2021-10-27 DIAGNOSIS — Z510 Encounter for antineoplastic radiation therapy: Secondary | ICD-10-CM | POA: Insufficient documentation

## 2021-10-27 DIAGNOSIS — C771 Secondary and unspecified malignant neoplasm of intrathoracic lymph nodes: Secondary | ICD-10-CM | POA: Diagnosis not present

## 2021-10-27 NOTE — Progress Notes (Signed)
  Radiation Oncology         [phone] ________________________________  Name: [name] MRN: [id number]  Date: 10/27/2021  DOB: Sep 01, 1945  ULTRAHYPOFRACTIONATED RADIOTHERAPY  SIMULATION AND TREATMENT PLANNING NOTE    ICD-10-CM   1. Recurrent prostate adenocarcinoma ([place])  C61     2. 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  NARRATIVE:  The patient was brought to the [facility].  Identity was confirmed.  All relevant records and images related to the planned course of therapy were reviewed.  The patient freely provided informed written consent to proceed with treatment after reviewing the details related to the planned course of therapy. The consent form was witnessed and verified by the simulation staff.  Then, the patient was set-up in a stable reproducible  supine position for radiation therapy.  A BodyFix immobilization pillow was fabricated for reproducible positioning.  Surface markings were placed.  The CT images were loaded into the planning software.  The gross target volumes (GTV) and planning target volumes (PTV) were delinieated, and avoidance structures were contoured.  Treatment planning then occurred.  The radiation prescription was entered and confirmed.  A total of two complex treatment devices were fabricated in the form of the BodyFix immobilization pillow and a neck accuform cushion.  I have requested : 3D Simulation  I have requested a DVH of the following structures: targets and all normal structures near the target including lungs, heart, spinal cord in the mediastinum, and rectum/bladder in the pelvis as noted on the radiation plan to maintain doses in adherence with established limits  SPECIAL TREATMENT PROCEDURE:  The planned course of therapy  using radiation constitutes a special treatment procedure. Special care is required in the management of this patient for the following reasons. High dose per fraction requiring special monitoring for increased toxicities of treatment including daily imaging..  The special nature of the planned course of radiotherapy will require increased physician supervision and oversight to ensure patient's safety with optimal treatment outcomes.    This requires extended time and effort.    PLAN:  The patient will receive 50 Gy in 10 fractions.  ________________________________  [name], M.D.

## 2021-10-28 DIAGNOSIS — C61 Malignant neoplasm of prostate: Secondary | ICD-10-CM | POA: Diagnosis not present

## 2021-10-31 DIAGNOSIS — Z191 Hormone sensitive malignancy status: Secondary | ICD-10-CM | POA: Diagnosis not present

## 2021-10-31 DIAGNOSIS — C61 Malignant neoplasm of prostate: Secondary | ICD-10-CM | POA: Diagnosis not present

## 2021-10-31 DIAGNOSIS — C771 Secondary and unspecified malignant neoplasm of intrathoracic lymph nodes: Secondary | ICD-10-CM | POA: Diagnosis not present

## 2021-10-31 DIAGNOSIS — Z510 Encounter for antineoplastic radiation therapy: Secondary | ICD-10-CM | POA: Diagnosis not present

## 2021-11-03 DIAGNOSIS — C771 Secondary and unspecified malignant neoplasm of intrathoracic lymph nodes: Secondary | ICD-10-CM | POA: Diagnosis not present

## 2021-11-03 DIAGNOSIS — C61 Malignant neoplasm of prostate: Secondary | ICD-10-CM | POA: Diagnosis not present

## 2021-11-03 DIAGNOSIS — Z191 Hormone sensitive malignancy status: Secondary | ICD-10-CM | POA: Diagnosis not present

## 2021-11-10 ENCOUNTER — Other Ambulatory Visit: Payer: Self-pay

## 2021-11-10 ENCOUNTER — Ambulatory Visit
Admission: RE | Admit: 2021-11-10 | Discharge: 2021-11-10 | Disposition: A | Discharge status: Home / Self Care | Payer: Medicare HMO | Source: Ambulatory Visit | Attending: Radiation Oncology | Admitting: Radiation Oncology

## 2021-11-10 DIAGNOSIS — Z510 Encounter for antineoplastic radiation therapy: Secondary | ICD-10-CM | POA: Diagnosis not present

## 2021-11-10 DIAGNOSIS — Z191 Hormone sensitive malignancy status: Secondary | ICD-10-CM | POA: Diagnosis not present

## 2021-11-10 DIAGNOSIS — C61 Malignant neoplasm of prostate: Secondary | ICD-10-CM | POA: Diagnosis not present

## 2021-11-10 DIAGNOSIS — C771 Secondary and unspecified malignant neoplasm of intrathoracic lymph nodes: Secondary | ICD-10-CM | POA: Diagnosis not present

## 2021-11-10 LAB — RAD ONC ARIA SESSION SUMMARY
Course Elapsed Days: 0
Plan Fractions Treated to Date: 1
Plan Prescribed Dose Per Fraction: 5 Gy
Plan Total Fractions Prescribed: 10
Plan Total Prescribed Dose: 50 Gy
Reference Point Dosage Given to Date: 5 Gy
Reference Point Session Dosage Given: 5 Gy
Session Number: 1

## 2021-11-11 ENCOUNTER — Other Ambulatory Visit: Payer: Self-pay

## 2021-11-11 ENCOUNTER — Ambulatory Visit
Admission: RE | Admit: 2021-11-11 | Discharge: 2021-11-11 | Disposition: A | Discharge status: Home / Self Care | Payer: Medicare HMO | Source: Ambulatory Visit | Attending: Radiation Oncology | Admitting: Radiation Oncology

## 2021-11-11 DIAGNOSIS — Z510 Encounter for antineoplastic radiation therapy: Secondary | ICD-10-CM | POA: Diagnosis not present

## 2021-11-11 DIAGNOSIS — Z191 Hormone sensitive malignancy status: Secondary | ICD-10-CM | POA: Diagnosis not present

## 2021-11-11 DIAGNOSIS — C771 Secondary and unspecified malignant neoplasm of intrathoracic lymph nodes: Secondary | ICD-10-CM | POA: Diagnosis not present

## 2021-11-11 DIAGNOSIS — C61 Malignant neoplasm of prostate: Secondary | ICD-10-CM | POA: Diagnosis not present

## 2021-11-11 LAB — RAD ONC ARIA SESSION SUMMARY
Course Elapsed Days: 1
Plan Fractions Treated to Date: 2
Plan Fractions Treated to Date: 2
Plan Prescribed Dose Per Fraction: 5 Gy
Plan Prescribed Dose Per Fraction: 5 Gy
Plan Total Fractions Prescribed: 10
Plan Total Fractions Prescribed: 10
Plan Total Prescribed Dose: 50 Gy
Plan Total Prescribed Dose: 50 Gy
Reference Point Dosage Given to Date: 10 Gy
Reference Point Dosage Given to Date: 10 Gy
Reference Point Session Dosage Given: 5 Gy
Reference Point Session Dosage Given: 5 Gy
Session Number: 2

## 2021-11-12 ENCOUNTER — Other Ambulatory Visit: Payer: Self-pay

## 2021-11-12 ENCOUNTER — Ambulatory Visit
Admission: RE | Admit: 2021-11-12 | Discharge: 2021-11-12 | Disposition: A | Discharge status: Home / Self Care | Payer: Medicare HMO | Source: Ambulatory Visit | Attending: Radiation Oncology | Admitting: Radiation Oncology

## 2021-11-12 DIAGNOSIS — C771 Secondary and unspecified malignant neoplasm of intrathoracic lymph nodes: Secondary | ICD-10-CM | POA: Diagnosis not present

## 2021-11-12 DIAGNOSIS — C61 Malignant neoplasm of prostate: Secondary | ICD-10-CM | POA: Diagnosis not present

## 2021-11-12 DIAGNOSIS — Z510 Encounter for antineoplastic radiation therapy: Secondary | ICD-10-CM | POA: Diagnosis not present

## 2021-11-12 DIAGNOSIS — Z191 Hormone sensitive malignancy status: Secondary | ICD-10-CM | POA: Diagnosis not present

## 2021-11-12 LAB — RAD ONC ARIA SESSION SUMMARY
Course Elapsed Days: 2
Plan Fractions Treated to Date: 3
Plan Prescribed Dose Per Fraction: 5 Gy
Plan Total Fractions Prescribed: 10
Plan Total Prescribed Dose: 50 Gy
Reference Point Dosage Given to Date: 15 Gy
Reference Point Session Dosage Given: 5 Gy
Session Number: 3

## 2021-11-13 ENCOUNTER — Ambulatory Visit
Admission: RE | Admit: 2021-11-13 | Discharge: 2021-11-13 | Disposition: A | Discharge status: Home / Self Care | Payer: Medicare HMO | Source: Ambulatory Visit | Attending: Radiation Oncology | Admitting: Radiation Oncology

## 2021-11-13 ENCOUNTER — Other Ambulatory Visit: Payer: Self-pay

## 2021-11-13 DIAGNOSIS — C771 Secondary and unspecified malignant neoplasm of intrathoracic lymph nodes: Secondary | ICD-10-CM | POA: Diagnosis not present

## 2021-11-13 DIAGNOSIS — Z510 Encounter for antineoplastic radiation therapy: Secondary | ICD-10-CM | POA: Diagnosis not present

## 2021-11-13 DIAGNOSIS — C61 Malignant neoplasm of prostate: Secondary | ICD-10-CM | POA: Diagnosis not present

## 2021-11-13 DIAGNOSIS — Z191 Hormone sensitive malignancy status: Secondary | ICD-10-CM | POA: Diagnosis not present

## 2021-11-13 LAB — RAD ONC ARIA SESSION SUMMARY
Course Elapsed Days: 3
Plan Fractions Treated to Date: 4
Plan Fractions Treated to Date: 4
Plan Prescribed Dose Per Fraction: 5 Gy
Plan Prescribed Dose Per Fraction: 5 Gy
Plan Total Fractions Prescribed: 10
Plan Total Fractions Prescribed: 10
Plan Total Prescribed Dose: 50 Gy
Plan Total Prescribed Dose: 50 Gy
Reference Point Dosage Given to Date: 20 Gy
Reference Point Dosage Given to Date: 20 Gy
Reference Point Session Dosage Given: 5 Gy
Reference Point Session Dosage Given: 5 Gy
Session Number: 4

## 2021-11-14 ENCOUNTER — Other Ambulatory Visit: Payer: Self-pay

## 2021-11-14 ENCOUNTER — Ambulatory Visit: Payer: Medicare HMO

## 2021-11-14 ENCOUNTER — Ambulatory Visit
Admission: RE | Admit: 2021-11-14 | Discharge: 2021-11-14 | Disposition: A | Discharge status: Home / Self Care | Payer: Medicare HMO | Source: Ambulatory Visit | Attending: Radiation Oncology | Admitting: Radiation Oncology

## 2021-11-14 DIAGNOSIS — Z510 Encounter for antineoplastic radiation therapy: Secondary | ICD-10-CM | POA: Diagnosis not present

## 2021-11-14 DIAGNOSIS — C771 Secondary and unspecified malignant neoplasm of intrathoracic lymph nodes: Secondary | ICD-10-CM | POA: Diagnosis not present

## 2021-11-14 DIAGNOSIS — Z191 Hormone sensitive malignancy status: Secondary | ICD-10-CM | POA: Diagnosis not present

## 2021-11-14 DIAGNOSIS — C61 Malignant neoplasm of prostate: Secondary | ICD-10-CM | POA: Diagnosis not present

## 2021-11-14 LAB — RAD ONC ARIA SESSION SUMMARY
Course Elapsed Days: 4
Plan Fractions Treated to Date: 5
Plan Fractions Treated to Date: 5
Plan Prescribed Dose Per Fraction: 5 Gy
Plan Prescribed Dose Per Fraction: 5 Gy
Plan Total Fractions Prescribed: 10
Plan Total Fractions Prescribed: 10
Plan Total Prescribed Dose: 50 Gy
Plan Total Prescribed Dose: 50 Gy
Reference Point Dosage Given to Date: 25 Gy
Reference Point Dosage Given to Date: 25 Gy
Reference Point Session Dosage Given: 5 Gy
Reference Point Session Dosage Given: 5 Gy
Session Number: 5

## 2021-11-17 ENCOUNTER — Other Ambulatory Visit: Payer: Self-pay

## 2021-11-17 ENCOUNTER — Ambulatory Visit
Admission: RE | Admit: 2021-11-17 | Discharge: 2021-11-17 | Disposition: A | Discharge status: Home / Self Care | Payer: Medicare HMO | Source: Ambulatory Visit | Attending: Radiation Oncology | Admitting: Radiation Oncology

## 2021-11-17 ENCOUNTER — Ambulatory Visit: Payer: Medicare HMO

## 2021-11-17 DIAGNOSIS — Z510 Encounter for antineoplastic radiation therapy: Secondary | ICD-10-CM | POA: Diagnosis not present

## 2021-11-17 DIAGNOSIS — C771 Secondary and unspecified malignant neoplasm of intrathoracic lymph nodes: Secondary | ICD-10-CM | POA: Diagnosis not present

## 2021-11-17 DIAGNOSIS — C61 Malignant neoplasm of prostate: Secondary | ICD-10-CM | POA: Diagnosis not present

## 2021-11-17 DIAGNOSIS — Z191 Hormone sensitive malignancy status: Secondary | ICD-10-CM | POA: Diagnosis not present

## 2021-11-17 LAB — RAD ONC ARIA SESSION SUMMARY
Course Elapsed Days: 7
Plan Fractions Treated to Date: 6
Plan Fractions Treated to Date: 6
Plan Prescribed Dose Per Fraction: 5 Gy
Plan Prescribed Dose Per Fraction: 5 Gy
Plan Total Fractions Prescribed: 10
Plan Total Fractions Prescribed: 10
Plan Total Prescribed Dose: 50 Gy
Plan Total Prescribed Dose: 50 Gy
Reference Point Dosage Given to Date: 30 Gy
Reference Point Dosage Given to Date: 30 Gy
Reference Point Session Dosage Given: 5 Gy
Reference Point Session Dosage Given: 5 Gy
Session Number: 6

## 2021-11-19 ENCOUNTER — Other Ambulatory Visit: Payer: Self-pay

## 2021-11-19 ENCOUNTER — Ambulatory Visit
Admission: RE | Admit: 2021-11-19 | Discharge: 2021-11-19 | Disposition: A | Discharge status: Home / Self Care | Payer: Medicare HMO | Source: Ambulatory Visit | Attending: Radiation Oncology | Admitting: Radiation Oncology

## 2021-11-19 DIAGNOSIS — Z510 Encounter for antineoplastic radiation therapy: Secondary | ICD-10-CM | POA: Diagnosis not present

## 2021-11-19 DIAGNOSIS — C61 Malignant neoplasm of prostate: Secondary | ICD-10-CM | POA: Diagnosis not present

## 2021-11-19 DIAGNOSIS — Z191 Hormone sensitive malignancy status: Secondary | ICD-10-CM | POA: Diagnosis not present

## 2021-11-19 DIAGNOSIS — C771 Secondary and unspecified malignant neoplasm of intrathoracic lymph nodes: Secondary | ICD-10-CM | POA: Diagnosis not present

## 2021-11-19 LAB — RAD ONC ARIA SESSION SUMMARY
Course Elapsed Days: 9
Plan Fractions Treated to Date: 7
Plan Fractions Treated to Date: 7
Plan Prescribed Dose Per Fraction: 5 Gy
Plan Prescribed Dose Per Fraction: 5 Gy
Plan Total Fractions Prescribed: 10
Plan Total Fractions Prescribed: 10
Plan Total Prescribed Dose: 50 Gy
Plan Total Prescribed Dose: 50 Gy
Reference Point Dosage Given to Date: 35 Gy
Reference Point Dosage Given to Date: 35 Gy
Reference Point Session Dosage Given: 5 Gy
Reference Point Session Dosage Given: 5 Gy
Session Number: 7

## 2021-11-20 ENCOUNTER — Other Ambulatory Visit: Payer: Self-pay

## 2021-11-20 ENCOUNTER — Ambulatory Visit
Admission: RE | Admit: 2021-11-20 | Discharge: 2021-11-20 | Disposition: A | Discharge status: Home / Self Care | Payer: Medicare HMO | Source: Ambulatory Visit | Attending: Radiation Oncology | Admitting: Radiation Oncology

## 2021-11-20 DIAGNOSIS — Z510 Encounter for antineoplastic radiation therapy: Secondary | ICD-10-CM | POA: Diagnosis not present

## 2021-11-20 DIAGNOSIS — C61 Malignant neoplasm of prostate: Secondary | ICD-10-CM | POA: Diagnosis not present

## 2021-11-20 DIAGNOSIS — C771 Secondary and unspecified malignant neoplasm of intrathoracic lymph nodes: Secondary | ICD-10-CM | POA: Diagnosis not present

## 2021-11-20 DIAGNOSIS — Z191 Hormone sensitive malignancy status: Secondary | ICD-10-CM | POA: Diagnosis not present

## 2021-11-20 LAB — RAD ONC ARIA SESSION SUMMARY
Course Elapsed Days: 10
Plan Fractions Treated to Date: 8
Plan Fractions Treated to Date: 8
Plan Prescribed Dose Per Fraction: 5 Gy
Plan Prescribed Dose Per Fraction: 5 Gy
Plan Total Fractions Prescribed: 10
Plan Total Fractions Prescribed: 10
Plan Total Prescribed Dose: 50 Gy
Plan Total Prescribed Dose: 50 Gy
Reference Point Dosage Given to Date: 40 Gy
Reference Point Dosage Given to Date: 40 Gy
Reference Point Session Dosage Given: 5 Gy
Reference Point Session Dosage Given: 5 Gy
Session Number: 8

## 2021-11-21 ENCOUNTER — Ambulatory Visit
Admission: RE | Admit: 2021-11-21 | Discharge: 2021-11-21 | Disposition: A | Discharge status: Home / Self Care | Payer: Medicare HMO | Source: Ambulatory Visit | Attending: Radiation Oncology | Admitting: Radiation Oncology

## 2021-11-21 ENCOUNTER — Other Ambulatory Visit: Payer: Self-pay

## 2021-11-21 DIAGNOSIS — Z510 Encounter for antineoplastic radiation therapy: Secondary | ICD-10-CM | POA: Diagnosis not present

## 2021-11-21 DIAGNOSIS — C771 Secondary and unspecified malignant neoplasm of intrathoracic lymph nodes: Secondary | ICD-10-CM | POA: Diagnosis not present

## 2021-11-21 DIAGNOSIS — C61 Malignant neoplasm of prostate: Secondary | ICD-10-CM | POA: Diagnosis not present

## 2021-11-21 DIAGNOSIS — Z191 Hormone sensitive malignancy status: Secondary | ICD-10-CM | POA: Diagnosis not present

## 2021-11-21 LAB — RAD ONC ARIA SESSION SUMMARY
Course Elapsed Days: 11
Plan Fractions Treated to Date: 9
Plan Fractions Treated to Date: 9
Plan Prescribed Dose Per Fraction: 5 Gy
Plan Prescribed Dose Per Fraction: 5 Gy
Plan Total Fractions Prescribed: 10
Plan Total Fractions Prescribed: 10
Plan Total Prescribed Dose: 50 Gy
Plan Total Prescribed Dose: 50 Gy
Reference Point Dosage Given to Date: 45 Gy
Reference Point Dosage Given to Date: 45 Gy
Reference Point Session Dosage Given: 5 Gy
Reference Point Session Dosage Given: 5 Gy
Session Number: 9

## 2021-11-24 ENCOUNTER — Telehealth: Payer: Self-pay | Admitting: Pharmacist

## 2021-11-24 ENCOUNTER — Ambulatory Visit
Admission: RE | Admit: 2021-11-24 | Discharge: 2021-11-24 | Disposition: A | Discharge status: Home / Self Care | Payer: Medicare HMO | Source: Ambulatory Visit | Attending: Radiation Oncology | Admitting: Radiation Oncology

## 2021-11-24 ENCOUNTER — Other Ambulatory Visit: Payer: Self-pay

## 2021-11-24 ENCOUNTER — Encounter: Payer: Self-pay | Admitting: Radiation Oncology

## 2021-11-24 DIAGNOSIS — C61 Malignant neoplasm of prostate: Secondary | ICD-10-CM | POA: Diagnosis not present

## 2021-11-24 DIAGNOSIS — Z191 Hormone sensitive malignancy status: Secondary | ICD-10-CM | POA: Diagnosis not present

## 2021-11-24 DIAGNOSIS — Z510 Encounter for antineoplastic radiation therapy: Secondary | ICD-10-CM | POA: Diagnosis not present

## 2021-11-24 DIAGNOSIS — C771 Secondary and unspecified malignant neoplasm of intrathoracic lymph nodes: Secondary | ICD-10-CM | POA: Diagnosis not present

## 2021-11-24 LAB — RAD ONC ARIA SESSION SUMMARY
Course Elapsed Days: 14
Plan Fractions Treated to Date: 10
Plan Fractions Treated to Date: 10
Plan Prescribed Dose Per Fraction: 5 Gy
Plan Prescribed Dose Per Fraction: 5 Gy
Plan Total Fractions Prescribed: 10
Plan Total Fractions Prescribed: 10
Plan Total Prescribed Dose: 50 Gy
Plan Total Prescribed Dose: 50 Gy
Reference Point Dosage Given to Date: 50 Gy
Reference Point Dosage Given to Date: 50 Gy
Reference Point Session Dosage Given: 5 Gy
Reference Point Session Dosage Given: 5 Gy
Session Number: 10

## 2021-11-24 NOTE — Chronic Care Management (AMB) (Signed)
Chronic Care Management Pharmacy Assistant   Name: [name]  MRN: [id number] DOB: 08/01/1945  Reason for Encounter: Disease State   Conditions to be addressed/monitored: HTN  Recent office visits:  None  Recent consult visits:  11/24/21 - Patient presented to [facility] for [place].  10/27/21 - Patient presented to [facility] for [place].  10/21/21 Patient presented to [facility] for Reconsult/ Nurse Evaluation.  10/20/21 BETHEA[place]LES D ([place] - [facility] presented to [facility] for Type 2 diabetes mellitus without complications. No medication changes.  10/14/21 ROBINSON,JAMIE W ([place]) - Patient presented to [facility] for Low back pain unspecified. No medication changes.  10/07/21 [name] ( Urology) - Claims encounter for Right testicular pain and other concerns. No other visit details available.  10/02/21 IRVIN,STEPHEN B ([facility]) - Patient presented for pain in right hip. No medication changes.  Hospital visits:  None in previous 6 months  Medications: Outpatient Encounter Medications as of 11/24/2021  Medication Sig   acetaminophen (TYLENOL) 325 MG tablet TAKE TWO TABLETS BY MOUTH FOUR TIMES A DAY AS NEEDED FOR PAIN   Aspirin 81 MG CAPS Aspirin 81 mg   aspirin 81 MG tablet Take 81 mg by mouth daily.   azaTHIOprine (IMURAN) 50 MG tablet Take 100 mg by mouth in the morning and at bedtime.   benazepril-hydrochlorthiazide (LOTENSIN HCT) 10-12.5 MG tablet HCTZ   benzonatate (TESSALON) 100 MG capsule Take by mouth.   Brinzolamide-Brimonidine (SIMBRINZA) 1-0.2 % SUSP Place 1 drop into both eyes 3 times daily.   Calcium Carbonate-Vitamin D 600-125 MG-UNIT TABS Take 1 tablet by mouth daily.     carboxymethylcellulose (REFRESH PLUS) 0.5 % SOLN INSTILL 1 DROP IN BOTH EYES FOUR TIMES A DAY --  TO REPLACE CARBOXYMETHYLCELLULOSE 1% OPH GEL 0.4ML (PRESERVATIVE FREE)  WHILE IT IS ON LONG TERM MANUFACTURER BACKORDER AND IS CURRENTLY  UNAVAILABLE  --  TO REPLACE CARBOXYMETHYLCELLULOSE 1% OPH GEL 0.4ML (PRESERVATIVE FREE)   WHILE IT IS ON LONG TERM MANUFACTURER BACKORDER AND IS CURRENTLY   UNAVAILABLE   cyclobenzaprine (FLEXERIL) 10 MG tablet TAKE ONE-HALF TABLET BY MOUTH THREE TIMES A DAY AS NEEDED BACK PAIN (MAY CAUSE DROWSINESS)   empagliflozin (JARDIANCE) 25 MG TABS tablet Take 12.5 mg by mouth daily.   fluticasone (FLONASE) 50 MCG/ACT nasal spray Place 2 sprays into both nostrils daily as needed.   gabapentin (NEURONTIN) 600 MG tablet Take 1 tablet by mouth 2 (two) times daily.   glipiZIDE (GLUCOTROL) 5 MG tablet Take 7.5 mg by mouth 2 (two) times daily before a meal.   Glycerin-Polysorbate 80 (REFRESH DRY EYE THERAPY OP) Place 1 drop into both eyes as needed (DRY EYES).   ketorolac (ACULAR) 0.5 % ophthalmic solution Place 1 drop into the right eye 4 times daily.   ketorolac (ACULAR) 0.5 % ophthalmic solution Place 1 drop into the right eye 4 times daily.   latanoprost (XALATAN) 0.005 % ophthalmic solution Place 1 drop into the right eye nightly.   Latanoprostene Bunod 0.024 % SOLN INSTILL 1 DROP IN BOTH EYES AT BEDTIME   lidocaine (LIDODERM) 5 % APPLY 1 PATCH TO SKIN ONCE DAILY (APPLY FOR 12 HOURS, THEN REMOVE FOR 12 HOURS)   loratadine (CLARITIN) 10 MG tablet Take 10 mg by mouth daily as needed for allergies.   losartan (COZAAR) 100 MG tablet Take 100 mg by mouth daily.   Melatonin 10 MG CAPS TAKE 1 TABLET/CAPSULE ('3MG'$ ) BY MOUTH AT BEDTIME SLEEP  melatonin 3 MG TABS tablet Take 3 mg by mouth at bedtime.   METFORMIN & DIET MANAGE PROD PO Metformin   metFORMIN (GLUCOPHAGE) 1000 MG tablet Take 1,000 mg by mouth 2 (two) times daily with a meal.   Multiple Vitamin (MULTIVITAMIN ADULT PO) Take by mouth.   Netarsudil Dimesylate 0.02 % SOLN INSTILL ONE DROP IN BOTH EYES DAILY   pravastatin (PRAVACHOL) 20 MG tablet Pravachol   pravastatin (PRAVACHOL) 40 MG tablet Take 40 mg by mouth daily.   PSYLLIUM PO TAKE 1 TEASPOONFUL BY MOUTH EVERY  DAY   timolol (TIMOPTIC) 0.5 % ophthalmic solution Place 1 drop into both eyes 2 times daily.   timolol (TIMOPTIC) 0.5 % ophthalmic solution Place 1 drop into both eyes 2 times daily.   No facility-administered encounter medications on file as of 11/24/2021.  Reviewed chart prior to disease state call. Spoke with patient regarding BP  Recent Office Vitals: BP Readings from Last 3 Encounters:  10/21/21 (!) 122/92  02/27/21 130/80  02/27/20 128/82   Pulse Readings from Last 3 Encounters:  10/21/21 87  02/27/21 (!) 57  02/27/20 73    Wt Readings from Last 3 Encounters:  10/21/21 263 lb 2 oz (119.4 kg)  02/27/21 266 lb 9.6 oz (120.9 kg)  02/27/20 266 lb 3.2 oz (120.7 kg)     Kidney Function Lab Results  Component Value Date/Time   CREATININE 1.30 (H) 02/27/2020 10:30 AM   CREATININE 1.13 02/13/2016 08:14 AM   CREATININE 1.35 (H) 02/12/2015 12:01 AM   CREATININE 1.2 02/03/2013 01:45 PM   GFRNONAA 53 (L) 02/27/2020 10:30 AM   GFRAA 62 02/27/2020 10:30 AM       Latest Ref Rng & Units 02/27/2020   10:30 AM 02/13/2016    8:14 AM 02/12/2015   12:01 AM  BMP  Glucose 65 - 99 mg/dL 156  115  82   BUN 8 - 27 mg/dL '15  13  17   '$ Creatinine 0.76 - 1.27 mg/dL 1.30  1.13  1.35   BUN/Creat Ratio 10 - 24 12     Sodium 134 - 144 mmol/L 141  141  139   Potassium 3.5 - 5.2 mmol/L 4.9  4.5  4.0   Chloride 96 - 106 mmol/L 106  104  105   CO2 20 - 29 mmol/L '26  25  26   '$ Calcium 8.6 - 10.2 mg/dL 9.6  9.2  9.5     Current antihypertensive regimen:  Losartan 100 mg 1 tablet daily How often are you checking your Blood Pressure? infrequently Current home BP readings: Per Chart notes and wife patient has been having treatments regularly at the cancer center BP Readings from Last 3 Encounters:  10/21/21 (!) 122/92  02/27/21 130/80  02/27/20 128/82   What recent interventions/DTPs have been made by any provider to improve Blood Pressure control since last CPP Visit: Wife reports none Any  recent hospitalizations or ED visits since last visit with CPP? No Wife reports he is hanging in there and seems to be doing as best as he can. She reports he denies any hyper/hypotensive symptoms and they do not have any needs at this time.  Adherence Review: Is the patient currently on ACE/ARB medication? Yes Does the patient have >5 day gap between last estimated fill dates? No     Care Gaps: HGB A1C - Overdue BP- 128/77 (10/02/21) AWV- 9/23 Lab Results  Component Value Date   HGBA1C 7.9 (A) 02/27/2021    Star  Rating Drugs: Glipizide 5 mg - [facility] Losartan 100 mg - [facility] fil [facility]x [facility]) Metformin 500 mg -  [facility] fil [facility]x [facility]) Pravastatin 10 mg -  [facility] fil [facility]x [facility])     [place] Clinical Pharmacist Assistant [phone]

## 2021-11-27 ENCOUNTER — Other Ambulatory Visit: Payer: Self-pay | Admitting: Radiation Oncology

## 2021-11-27 ENCOUNTER — Telehealth: Payer: Self-pay

## 2021-11-27 MED ORDER — SUCRALFATE 1 G PO TABS: 1.0000 g | ORAL_TABLET | Freq: Three times a day (TID) | ORAL | 2 refills | Status: DC

## 2021-11-27 NOTE — Telephone Encounter (Signed)
RN returned call verified identity and spoke about swallowing concerns.  Informed Dr. Tammi Klippel and will follow-up.

## 2021-11-27 NOTE — Telephone Encounter (Signed)
RN called patient back to inform him that prescription was called into his pharmacy and that he could pick it up per Dr. Tammi Klippel.

## 2021-12-02 DIAGNOSIS — C61 Malignant neoplasm of prostate: Secondary | ICD-10-CM | POA: Diagnosis not present

## 2021-12-02 DIAGNOSIS — Z5111 Encounter for antineoplastic chemotherapy: Secondary | ICD-10-CM | POA: Diagnosis not present

## 2021-12-08 DIAGNOSIS — C61 Malignant neoplasm of prostate: Secondary | ICD-10-CM | POA: Diagnosis not present

## 2021-12-15 DIAGNOSIS — C61 Malignant neoplasm of prostate: Secondary | ICD-10-CM | POA: Diagnosis not present

## 2021-12-15 DIAGNOSIS — N393 Stress incontinence (female) (male): Secondary | ICD-10-CM | POA: Diagnosis not present

## 2021-12-15 DIAGNOSIS — C778 Secondary and unspecified malignant neoplasm of lymph nodes of multiple regions: Secondary | ICD-10-CM | POA: Diagnosis not present

## 2022-01-05 NOTE — Progress Notes (Signed)
  Radiation Oncology         [phone] ________________________________  Name: [name] MRN: [id number]  Date: 11/24/2021  DOB: Oct 02, 1945  End of Treatment Note  Diagnosis:    76 y.o. 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     Indication for treatment:  Palliation       Radiation treatment dates:   6/26-7/10/23  Site/dose:   The prostate fossa and mediastinal lymph nodes received 50 Gy in 10 fractions  Beams/energy: The patient was treated using stereotactic body radiotherapy according to a 3D conformal radiotherapy plan.  Volumetric arc fields were employed to deliver 6 MV X-rays.  Image guidance was performed with per fraction cone beam CT prior to treatment under personal MD supervision.  Immobilization was achieved using BodyFix Pillow.   Narrative: The patient tolerated radiation treatment relatively well.     Plan: The patient has completed radiation treatment. The patient will return to radiation oncology clinic for routine followup in one month. I advised him to call or return sooner if he has any questions or concerns related to his recovery or treatment. ________________________________  Sheral Apley. Tammi Klippel, M.D.

## 2022-01-07 ENCOUNTER — Encounter: Payer: Self-pay | Admitting: Urology

## 2022-01-07 NOTE — Progress Notes (Signed)
Telephone appointment. I verified patient's identity and began nursing interview. Patient doing well. No issues reported at this time.  Meaningful use complete. I-PSS score of 4-mild. No urinary management medications. Urology appt- Sep, 2023  Reminded patient of his 11:00am-01/08/2022 telephone appointment w/ Ashlyn Bruning PA-C. I left my extension [phone] in case patient needs anything. Patient verbalized understanding.  Patient contact [phone]

## 2022-01-08 ENCOUNTER — Ambulatory Visit
Admission: RE | Admit: 2022-01-08 | Discharge: 2022-01-08 | Disposition: A | Discharge status: Home / Self Care | Payer: Medicare HMO | Source: Ambulatory Visit | Attending: Urology | Admitting: Urology

## 2022-01-08 DIAGNOSIS — C61 Malignant neoplasm of prostate: Secondary | ICD-10-CM | POA: Insufficient documentation

## 2022-01-08 DIAGNOSIS — C775 Secondary and unspecified malignant neoplasm of intrapelvic lymph nodes: Secondary | ICD-10-CM | POA: Insufficient documentation

## 2022-01-08 NOTE — Progress Notes (Signed)
Radiation Oncology         [phone] ________________________________  Name: [name] MRN: [id number]  Date: 01/08/2022  DOB: 1945-05-19  Post Treatment Note  CC: [name], MD  [name], MD  Diagnosis:   76 y.o. 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    Interval Since Last Radiation:  6.5 weeks  6/26-7/10/23: The prostate fossa and mediastinal lymph nodes received 50 Gy in 10 fractions (concurrent with ADT)   11/13/19 - 11/23/19: The involved abdominopelvic lymph nodes were treated to 50 Gy in 5 fractions of 10 Gy; concurrent with ADT   02/2007-05/11/2007: Prostate Fossa (Dr. Valere Dross) The patient's largest fields covered only the prostate fossa and not the nodes as displayed below      Narrative:  I spoke with the patient to conduct his routine scheduled 1 month follow up visit via telephone to spare the patient unnecessary potential exposure in the healthcare setting during the current COVID-19 pandemic.  The patient was notified in advance and gave permission to proceed with this visit format.  He tolerated radiation treatment relatively well. He did develop some mild-moderate esophagitis and fatigue.                              On review of systems, the patient states that he is doing very well in general. The esophagitis has completely resolved and he is currently without complaints. He denies abdominal pain, N/V/D or constipation. He reports a healthy appetite and is maintaining his weight. He is tolerating the ADT well and has had a good response to therapy with his PSA decreased to 0.02 most recently. He reports mild decreased stamina but overall, is quite pleased with his progress to date.  ALLERGIES:  is allergic to ace inhibitors and lisinopril.  Meds: Current Outpatient Medications  Medication Sig Dispense Refill   acetaminophen (TYLENOL) 325 MG tablet TAKE TWO TABLETS  BY MOUTH FOUR TIMES A DAY AS NEEDED FOR PAIN     Aspirin 81 MG CAPS Aspirin 81 mg     aspirin 81 MG tablet Take 81 mg by mouth daily.     azaTHIOprine (IMURAN) 50 MG tablet Take 100 mg by mouth in the morning and at bedtime.     benazepril-hydrochlorthiazide (LOTENSIN HCT) 10-12.5 MG tablet HCTZ     benzonatate (TESSALON) 100 MG capsule Take by mouth.     Brinzolamide-Brimonidine (SIMBRINZA) 1-0.2 % SUSP Place 1 drop into both eyes 3 times daily.     Calcium Carbonate-Vitamin D 600-125 MG-UNIT TABS Take 1 tablet by mouth daily.       carboxymethylcellulose (REFRESH PLUS) 0.5 % SOLN INSTILL 1 DROP IN BOTH EYES FOUR TIMES A DAY --  TO REPLACE CARBOXYMETHYLCELLULOSE 1% OPH GEL 0.4ML (PRESERVATIVE FREE)  WHILE IT IS ON LONG TERM MANUFACTURER BACKORDER AND IS CURRENTLY  UNAVAILABLE --  TO REPLACE CARBOXYMETHYLCELLULOSE 1% OPH GEL 0.4ML (PRESERVATIVE FREE)   WHILE IT IS ON LONG TERM MANUFACTURER BACKORDER AND IS CURRENTLY   UNAVAILABLE     cyclobenzaprine (FLEXERIL) 10 MG tablet TAKE ONE-HALF TABLET BY MOUTH THREE TIMES A DAY AS NEEDED BACK PAIN (MAY CAUSE DROWSINESS)     empagliflozin (JARDIANCE) 25 MG TABS tablet Take 12.5 mg by mouth daily.     fluticasone (FLONASE) 50 MCG/ACT nasal spray Place 2 sprays into both nostrils daily as needed.     gabapentin (NEURONTIN)  600 MG tablet Take 1 tablet by mouth 2 (two) times daily.     glipiZIDE (GLUCOTROL) 5 MG tablet Take 7.5 mg by mouth 2 (two) times daily before a meal.     Glycerin-Polysorbate 80 (REFRESH DRY EYE THERAPY OP) Place 1 drop into both eyes as needed (DRY EYES).     ketorolac (ACULAR) 0.5 % ophthalmic solution Place 1 drop into the right eye 4 times daily.     ketorolac (ACULAR) 0.5 % ophthalmic solution Place 1 drop into the right eye 4 times daily.     latanoprost (XALATAN) 0.005 % ophthalmic solution Place 1 drop into the right eye nightly.     Latanoprostene Bunod 0.024 % SOLN INSTILL 1 DROP IN BOTH EYES AT BEDTIME     lidocaine (LIDODERM)  5 % APPLY 1 PATCH TO SKIN ONCE DAILY (APPLY FOR 12 HOURS, THEN REMOVE FOR 12 HOURS)     loratadine (CLARITIN) 10 MG tablet Take 10 mg by mouth daily as needed for allergies.     losartan (COZAAR) 100 MG tablet Take 100 mg by mouth daily.     Melatonin 10 MG CAPS TAKE 1 TABLET/CAPSULE ('3MG'$ ) BY MOUTH AT BEDTIME SLEEP     melatonin 3 MG TABS tablet Take 3 mg by mouth at bedtime.     METFORMIN & DIET MANAGE PROD PO Metformin     metFORMIN (GLUCOPHAGE) 1000 MG tablet Take 1,000 mg by mouth 2 (two) times daily with a meal.     Multiple Vitamin (MULTIVITAMIN ADULT PO) Take by mouth.     Netarsudil Dimesylate 0.02 % SOLN INSTILL ONE DROP IN BOTH EYES DAILY     pravastatin (PRAVACHOL) 20 MG tablet Pravachol     pravastatin (PRAVACHOL) 40 MG tablet Take 40 mg by mouth daily.     PSYLLIUM PO TAKE 1 TEASPOONFUL BY MOUTH EVERY DAY     sucralfate (CARAFATE) 1 g tablet Take 1 tablet (1 g total) by mouth 4 (four) times daily -  with meals and at bedtime. 5 min before meals for radiation induced esophagitis 120 tablet 2   timolol (TIMOPTIC) 0.5 % ophthalmic solution Place 1 drop into both eyes 2 times daily.     timolol (TIMOPTIC) 0.5 % ophthalmic solution Place 1 drop into both eyes 2 times daily.     No current facility-administered medications for this encounter.    Physical Findings:  vitals were not taken for this visit.  Pain Assessment Pain Score: 0-No pain/10 Unable to assess due to telephone follow up visit format.  Lab Findings: Lab Results  Component Value Date   WBC 4.5 02/27/2020   HGB 12.3 (L) 02/27/2020   HCT 38.6 02/27/2020   MCV 90 02/27/2020   PLT 246 02/27/2020     Radiographic Findings: No results found.  Impression/Plan: 1. 76 y.o. 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   He has recovered well from the effects of his recent SBRT and is currently without complaints. We discussed that  while we are happy to continue to participate in his care if clinically indicated, at this point, we will plan to follow up on an as needed basis. He will continue in routine follow up under the care and direction of Dr. Jeffie Pollock with the current plan being to continue ADT for 18 months. He got a 6 month Eligard injection on 12/02/21 and is scheduled for repeat labs on 03/27/22 and follow up office visit in  06/2022. He appears to have of good understanding of our recommendations and is comfortable and in agreement with the stated plan. We enjoyed taking care of his and look forward to continuing to follow his progress via correspondence. He knows that he is welcome to call at any time with questions or concerns related to his radiation treatments.    Nicholos Johns, PA-C

## 2022-02-04 ENCOUNTER — Telehealth: Payer: Self-pay | Admitting: Pharmacist

## 2022-02-04 NOTE — Chronic Care Management (AMB) (Signed)
    Chronic Care Management Pharmacy Assistant   Done in error, 02/05/2022 appointment cancelled.

## 2022-02-05 ENCOUNTER — Telehealth: Payer: Medicare HMO

## 2022-03-09 ENCOUNTER — Ambulatory Visit (INDEPENDENT_AMBULATORY_CARE_PROVIDER_SITE_OTHER): Payer: Medicare HMO | Admitting: Family Medicine

## 2022-03-09 DIAGNOSIS — E118 Type 2 diabetes mellitus with unspecified complications: Secondary | ICD-10-CM | POA: Diagnosis not present

## 2022-03-09 DIAGNOSIS — E1169 Type 2 diabetes mellitus with other specified complication: Secondary | ICD-10-CM

## 2022-03-09 DIAGNOSIS — Z23 Encounter for immunization: Secondary | ICD-10-CM | POA: Diagnosis not present

## 2022-03-09 DIAGNOSIS — E1139 Type 2 diabetes mellitus with other diabetic ophthalmic complication: Secondary | ICD-10-CM

## 2022-03-09 DIAGNOSIS — M1990 Unspecified osteoarthritis, unspecified site: Secondary | ICD-10-CM | POA: Diagnosis not present

## 2022-03-09 DIAGNOSIS — I152 Hypertension secondary to endocrine disorders: Secondary | ICD-10-CM

## 2022-03-09 DIAGNOSIS — M1A9XX0 Chronic gout, unspecified, without tophus (tophi): Secondary | ICD-10-CM

## 2022-03-09 DIAGNOSIS — H469 Unspecified optic neuritis: Secondary | ICD-10-CM | POA: Diagnosis not present

## 2022-03-09 DIAGNOSIS — E785 Hyperlipidemia, unspecified: Secondary | ICD-10-CM

## 2022-03-09 DIAGNOSIS — N3946 Mixed incontinence: Secondary | ICD-10-CM

## 2022-03-09 DIAGNOSIS — C61 Malignant neoplasm of prostate: Secondary | ICD-10-CM

## 2022-03-09 DIAGNOSIS — E1159 Type 2 diabetes mellitus with other circulatory complications: Secondary | ICD-10-CM | POA: Diagnosis not present

## 2022-03-09 DIAGNOSIS — J301 Allergic rhinitis due to pollen: Secondary | ICD-10-CM

## 2022-03-09 DIAGNOSIS — H409 Unspecified glaucoma: Secondary | ICD-10-CM | POA: Diagnosis not present

## 2022-03-09 DIAGNOSIS — K08131 Complete loss of teeth due to caries, class I: Secondary | ICD-10-CM

## 2022-03-09 DIAGNOSIS — Z0000 Encounter for general adult medical examination without abnormal findings: Secondary | ICD-10-CM | POA: Diagnosis not present

## 2022-03-09 DIAGNOSIS — D126 Benign neoplasm of colon, unspecified: Secondary | ICD-10-CM

## 2022-03-09 DIAGNOSIS — C775 Secondary and unspecified malignant neoplasm of intrapelvic lymph nodes: Secondary | ICD-10-CM

## 2022-03-09 DIAGNOSIS — H42 Glaucoma in diseases classified elsewhere: Secondary | ICD-10-CM

## 2022-03-09 DIAGNOSIS — Z9849 Cataract extraction status, unspecified eye: Secondary | ICD-10-CM

## 2022-03-09 LAB — POCT GLYCOSYLATED HEMOGLOBIN (HGB A1C): Hemoglobin A1C: 6.7 % — AB (ref 4.0–5.6)

## 2022-03-09 NOTE — Patient Instructions (Signed)
  Mr. [name] , Thank you for taking time to come for your Medicare Wellness Visit. I appreciate your ongoing commitment to your health goals. Please review the following plan we discussed and let me know if I can assist you in the future.   These are the goals we discussed:  Goals   None     This is a list of the screening recommended for you and due dates:  Health Maintenance  Topic Date Due   Yearly kidney function blood test for diabetes  02/26/2021   Yearly kidney health urinalysis for diabetes  02/26/2021   COVID-19 Vaccine (6 - Pfizer risk series) 04/24/2021   Flu Shot  12/16/2021   Eye exam for diabetics  12/18/2021   Complete foot exam   02/27/2022   Hemoglobin A1C  09/08/2022   Tetanus Vaccine  09/06/2029   Pneumonia Vaccine  Completed   Hepatitis C Screening: USPSTF Recommendation to screen - Ages 70-79 yo.  Completed   Zoster (Shingles) Vaccine  Completed   HPV Vaccine  Aged Out   Colon Cancer Screening  Discontinued

## 2022-03-09 NOTE — Progress Notes (Signed)
[name] is a 76 y.o. male who presents for annual wellness visit ,CPE and follow-up on chronic medical conditions.  He is being followed closely by urology for his prostate cancer and has had beam radiation as well as hormone therapy.  He sees Dr. Jeffie Pollock regularly for this.  He continues to have some urinary incontinence. He also goes to the [place] for most of his care.  He does have underlying diabetes and is taking metformin, glipizide and Jardiance.  He does have underlying colonic polyps and plans to schedule up at the [place] concerning this.  Continues on Neurontin for his chronic back pain again through the [place].  He has not had any difficulty recently with gout.  He does have false teeth.  He has had difficulty with his eyes and presently is again being followed at the [facility] for multiple eye issues.  His allergies seem to be under good control.   Immunizations and Health Maintenance Immunization History  Administered Date(s) Administered   Fluad Quad(high Dose 65+) 02/15/2020, 02/27/2021   Influenza Split 02/04/2011, 02/04/2012, 01/30/2014   Influenza Whole 02/18/2009, 02/03/2010   Influenza, High Dose Seasonal PF 02/06/2013, 02/06/2014, 01/17/2015, 02/12/2015, 02/09/2016, 02/13/2016, 02/07/2017, 02/16/2017   Influenza,inj,Quad PF,6+ Mos 02/06/2013   Influenza-Unspecified 03/18/2006, 04/18/2007, 01/17/2008, 02/15/2009, 01/16/2010, 02/04/2011, 02/04/2012, 02/14/2018, 02/14/2018, 02/06/2019, 02/11/2019   PFIZER Comirnaty(Gray Top)Covid-19 Tri-Sucrose Vaccine 08/20/2020   PFIZER(Purple Top)SARS-COV-2 Vaccination 06/30/2019, 07/25/2019, 02/16/2020   Pfizer Covid-19 Vaccine Bivalent Booster 43yr & up 02/27/2021   Pneumococcal Conjugate-13 08/02/2013, 02/06/2014   Pneumococcal Polysaccharide-23 02/04/2012, 11/29/2020   Pneumococcal-Unspecified 06/20/2010   Tdap 10/16/2009, 02/03/2010, 09/07/2019   Zoster Recombinat (Shingrix) 03/25/2017, 06/02/2017   Zoster, Live 03/25/2006   Health Maintenance  Due  Topic Date Due   Diabetic kidney evaluation - GFR measurement  02/26/2021   Diabetic kidney evaluation - Urine ACR  02/26/2021   COVID-19 Vaccine (6 - Pfizer risk series) 04/24/2021   INFLUENZA VACCINE  12/16/2021   OPHTHALMOLOGY EXAM  12/18/2021   FOOT EXAM  02/27/2022    Last colonoscopy: 2016 Last PSA: 4/23 Dentist: no dentist Ophtho: Dr. CAshok Cordia Exercise: walking everyday   [facility] WJeffie PollockAdvanced Directives: Does Patient Have a Medical Advance Directive?: Yes Type of Advance Directive: Healthcare Power of [place] Does patient want to make changes to medical advance directive?: Yes (ED - Information included in AVS) Copy of HPort Depositin Chart?: Yes - validated most recent copy scanned in chart (See row information)  Depression screen:  See questionnaire below.        03/09/2022    9:47 AM 02/27/2021    9:27 AM 02/27/2020    9:39 AM 02/21/2019    9:36 AM 02/18/2018    8:16 AM  Depression screen PHQ 2/9  Decreased Interest 0 0 0 0 0  Down, Depressed, Hopeless 0 0 0 0 0  PHQ - 2 Score 0 0 0 0 0    Fall Screen: See Questionaire below.      03/09/2022    9:46 AM 02/27/2021    9:26 AM 02/27/2020    9:39 AM 02/21/2019    9:36 AM 02/18/2018    8:16 AM  Fall Risk   Falls in the past year? 0 1 1 0   Number falls in past yr: 0 0 1 0 2 or more  Injury with Fall? 0 0 0 0 No  Risk for fall due to :  History of fall(s) Impaired vision   Impaired vision  Follow up Falls evaluation completed Falls evaluation completed       ADL screen:  See questionnaire below.  Functional Status Survey: Is the patient deaf or have difficulty hearing?: No Does the patient have difficulty seeing, even when wearing glasses/contacts?: Yes (rt. eye vision bad) Does the patient have difficulty concentrating, remembering, or making decisions?: No Does the patient have difficulty walking or  climbing stairs?: Yes (sometimes uses a cane) Does the patient have difficulty dressing or bathing?: No Does the patient have difficulty doing errands alone such as visiting a doctor's office or shopping?: No   Review of Systems  Constitutional: -, -unexpected weight change, -anorexia, -fatigue Allergy: -sneezing, -itching, -congestion Dermatology: denies changing moles, rash, lumps ENT: -runny nose, -ear pain, -sore throat,  Cardiology:  -chest pain, -palpitations, -orthopnea, Respiratory: -cough, -shortness of breath, -dyspnea on exertion, -wheezing,  Gastroenterology: -abdominal pain, -nausea, -vomiting, -diarrhea, -constipation, -dysphagia Hematology: -bleeding or bruising problems Musculoskeletal: -arthralgias, -myalgias, -joint swelling, -back pain, - Ophthalmology: -vision changes,  Urology: -dysuria, -difficulty urinating,  -urinary frequency, -urgency, incontinence Neurology: -, -numbness, , -memory loss, -falls, -dizziness    PHYSICAL EXAM:   General Appearance: Alert, cooperative, no distress, appears stated age Head: Normocephalic, without obvious abnormality, atraumatic Eyes: PERRL, conjunctiva/corneas clear, EOM's intact, Ears: Normal TM's and external ear canals Nose: Nares normal, mucosa normal, no drainage or sinus   tenderness Throat: Lips, mucosa, and tongue normal; teeth and gums normal Neck: Supple, no lymphadenopathy, thyroid:no enlargement/tenderness/nodules; no carotid bruit or JVD Lungs: Clear to auscultation bilaterally without wheezes, rales or ronchi; respirations unlabored Heart: Regular rate and rhythm, S1 and S2 normal, no murmur, rub or gallop Abdomen: Soft, non-tender, nondistended, normoactive bowel sounds, no masses, no hepatosplenomegaly Skin: Skin color, texture, turgor normal, no rashes or lesions Lymph nodes: Cervical, supraclavicular, and axillary nodes normal Neurologic: CNII-XII intact, normal strength, sensation and gait; reflexes 2+ and  symmetric throughout   Psych: Normal mood, affect, hygiene and grooming Hemoglobin A1c is 6.7 ASSESSMENT/PLAN: Routine general medical examination at a health care facility - Plan: CBC with Differential/Platelet, Comprehensive metabolic panel, Lipid panel  Hypertension associated with diabetes ([place]) - Plan: HgB A1c, CBC with Differential/Platelet, Comprehensive metabolic panel  Allergic rhinitis due to pollen, unspecified seasonality  Adenomatous polyp of colon, unspecified part of colon  Diabetes mellitus type 2 with complications (HCC) - Plan: CBC with Differential/Platelet, Comprehensive metabolic panel, Lipid panel, POCT UA - Microalbumin  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)  Hyperlipidemia associated with type 2 diabetes mellitus ([place]) - Plan: Lipid panel  Optic neuropathy  Arthritis  Glaucoma, unspecified glaucoma type, unspecified laterality  Chronic gout without tophus, unspecified cause, unspecified site  Mixed stress and urge urinary incontinence  Recurrent prostate adenocarcinoma (HCC)  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  No therapy needed for the gout. Discussed  Immunization recommendations discussed.  Colonoscopy recommendations reviewed.   Medicare Attestation I have personally reviewed: The patient's medical and social history Their use of alcohol, tobacco or illicit drugs Their current medications and supplements The patient's functional ability including ADLs,fall risks, home safety risks,  cognitive, and hearing and visual impairment Diet and physical activities Evidence for depression or mood  disorders  The patient's weight, height, and BMI have been recorded in the chart.  I have made referrals, counseling, and provided education to the patient based on review of the above and I have provided the patient with a written personalized care plan for preventive services.     [name], MD   03/09/2022

## 2022-03-10 LAB — CBC WITH DIFFERENTIAL/PLATELET
Basophils Absolute: 0 10*3/uL (ref 0.0–0.2)
Basos: 1 %
EOS (ABSOLUTE): 0.2 10*3/uL (ref 0.0–0.4)
Eos: 5 %
Hematocrit: 37.8 % (ref 37.5–51.0)
Hemoglobin: 12.6 g/dL — ABNORMAL LOW (ref 13.0–17.7)
Immature Grans (Abs): 0 10*3/uL (ref 0.0–0.1)
Immature Granulocytes: 0 %
Lymphocytes Absolute: 0.8 10*3/uL (ref 0.7–3.1)
Lymphs: 19 %
MCH: 28.3 pg (ref 26.6–33.0)
MCHC: 33.3 g/dL (ref 31.5–35.7)
MCV: 85 fL (ref 79–97)
Monocytes Absolute: 0.7 10*3/uL (ref 0.1–0.9)
Monocytes: 16 %
Neutrophils Absolute: 2.5 10*3/uL (ref 1.4–7.0)
Neutrophils: 59 %
Platelets: 230 10*3/uL (ref 150–450)
RBC: 4.45 x10E6/uL (ref 4.14–5.80)
RDW: 14 % (ref 11.6–15.4)
WBC: 4.3 10*3/uL (ref 3.4–10.8)

## 2022-03-10 LAB — COMPREHENSIVE METABOLIC PANEL
ALT: 13 IU/L (ref 0–44)
AST: 22 IU/L (ref 0–40)
Albumin/Globulin Ratio: 1.4 (ref 1.2–2.2)
Albumin: 4.1 g/dL (ref 3.8–4.8)
Alkaline Phosphatase: 91 IU/L (ref 44–121)
BUN/Creatinine Ratio: 12 (ref 10–24)
BUN: 15 mg/dL (ref 8–27)
Bilirubin Total: 0.6 mg/dL (ref 0.0–1.2)
CO2: 25 mmol/L (ref 20–29)
Calcium: 9.8 mg/dL (ref 8.6–10.2)
Chloride: 105 mmol/L (ref 96–106)
Creatinine, Ser: 1.22 mg/dL (ref 0.76–1.27)
Globulin, Total: 3 g/dL (ref 1.5–4.5)
Glucose: 138 mg/dL — ABNORMAL HIGH (ref 70–99)
Potassium: 5 mmol/L (ref 3.5–5.2)
Sodium: 142 mmol/L (ref 134–144)
Total Protein: 7.1 g/dL (ref 6.0–8.5)
eGFR: 61 mL/min/{1.73_m2} (ref >59)

## 2022-03-10 LAB — LIPID PANEL
Chol/HDL Ratio: 2.3 ratio (ref 0.0–5.0)
Cholesterol, Total: 129 mg/dL (ref 100–199)
HDL: 56 mg/dL (ref >39)
LDL Chol Calc (NIH): 61 mg/dL (ref 0–99)
Triglycerides: 57 mg/dL (ref 0–149)
VLDL Cholesterol Cal: 12 mg/dL (ref 5–40)

## 2022-03-27 DIAGNOSIS — C61 Malignant neoplasm of prostate: Secondary | ICD-10-CM | POA: Diagnosis not present

## 2022-04-01 DIAGNOSIS — C778 Secondary and unspecified malignant neoplasm of lymph nodes of multiple regions: Secondary | ICD-10-CM | POA: Diagnosis not present

## 2022-04-01 DIAGNOSIS — N393 Stress incontinence (female) (male): Secondary | ICD-10-CM | POA: Diagnosis not present

## 2022-04-01 DIAGNOSIS — C61 Malignant neoplasm of prostate: Secondary | ICD-10-CM | POA: Diagnosis not present

## 2022-06-17 DIAGNOSIS — C61 Malignant neoplasm of prostate: Secondary | ICD-10-CM | POA: Diagnosis not present

## 2022-06-22 DIAGNOSIS — C61 Malignant neoplasm of prostate: Secondary | ICD-10-CM | POA: Diagnosis not present

## 2022-06-24 DIAGNOSIS — C778 Secondary and unspecified malignant neoplasm of lymph nodes of multiple regions: Secondary | ICD-10-CM | POA: Diagnosis not present

## 2022-06-24 DIAGNOSIS — C61 Malignant neoplasm of prostate: Secondary | ICD-10-CM | POA: Diagnosis not present

## 2022-06-24 DIAGNOSIS — N393 Stress incontinence (female) (male): Secondary | ICD-10-CM | POA: Diagnosis not present

## 2022-09-21 DIAGNOSIS — C61 Malignant neoplasm of prostate: Secondary | ICD-10-CM | POA: Diagnosis not present

## 2022-09-23 DIAGNOSIS — C61 Malignant neoplasm of prostate: Secondary | ICD-10-CM | POA: Diagnosis not present

## 2022-09-23 DIAGNOSIS — C778 Secondary and unspecified malignant neoplasm of lymph nodes of multiple regions: Secondary | ICD-10-CM | POA: Diagnosis not present

## 2022-10-22 DIAGNOSIS — C61 Malignant neoplasm of prostate: Secondary | ICD-10-CM | POA: Diagnosis not present

## 2022-12-23 DIAGNOSIS — C61 Malignant neoplasm of prostate: Secondary | ICD-10-CM | POA: Diagnosis not present

## 2022-12-30 DIAGNOSIS — N393 Stress incontinence (female) (male): Secondary | ICD-10-CM | POA: Diagnosis not present

## 2022-12-30 DIAGNOSIS — C778 Secondary and unspecified malignant neoplasm of lymph nodes of multiple regions: Secondary | ICD-10-CM | POA: Diagnosis not present

## 2022-12-30 DIAGNOSIS — C61 Malignant neoplasm of prostate: Secondary | ICD-10-CM | POA: Diagnosis not present

## 2023-03-23 ENCOUNTER — Encounter: Payer: Self-pay | Admitting: Family Medicine

## 2023-03-23 ENCOUNTER — Ambulatory Visit: Payer: Medicare HMO | Admitting: Family Medicine

## 2023-03-23 VITALS — BP 124/82 | HR 69 | Wt 236.6 lb

## 2023-03-23 DIAGNOSIS — I152 Hypertension secondary to endocrine disorders: Secondary | ICD-10-CM

## 2023-03-23 DIAGNOSIS — H469 Unspecified optic neuritis: Secondary | ICD-10-CM

## 2023-03-23 DIAGNOSIS — E1169 Type 2 diabetes mellitus with other specified complication: Secondary | ICD-10-CM

## 2023-03-23 DIAGNOSIS — H3581 Retinal edema: Secondary | ICD-10-CM

## 2023-03-23 DIAGNOSIS — D126 Benign neoplasm of colon, unspecified: Secondary | ICD-10-CM

## 2023-03-23 DIAGNOSIS — Z0000 Encounter for general adult medical examination without abnormal findings: Secondary | ICD-10-CM

## 2023-03-23 DIAGNOSIS — M1000 Idiopathic gout, unspecified site: Secondary | ICD-10-CM | POA: Diagnosis not present

## 2023-03-23 DIAGNOSIS — E1159 Type 2 diabetes mellitus with other circulatory complications: Secondary | ICD-10-CM

## 2023-03-23 DIAGNOSIS — K08131 Complete loss of teeth due to caries, class I: Secondary | ICD-10-CM

## 2023-03-23 DIAGNOSIS — C61 Malignant neoplasm of prostate: Secondary | ICD-10-CM | POA: Diagnosis not present

## 2023-03-23 DIAGNOSIS — E1139 Type 2 diabetes mellitus with other diabetic ophthalmic complication: Secondary | ICD-10-CM

## 2023-03-23 DIAGNOSIS — H42 Glaucoma in diseases classified elsewhere: Secondary | ICD-10-CM

## 2023-03-23 DIAGNOSIS — J301 Allergic rhinitis due to pollen: Secondary | ICD-10-CM | POA: Diagnosis not present

## 2023-03-23 DIAGNOSIS — M1990 Unspecified osteoarthritis, unspecified site: Secondary | ICD-10-CM

## 2023-03-23 DIAGNOSIS — C775 Secondary and unspecified malignant neoplasm of intrapelvic lymph nodes: Secondary | ICD-10-CM

## 2023-03-23 NOTE — Progress Notes (Signed)
[name] is a 77 y.o. male who presents for CPE,and follow-up on chronic medical conditions.  He has been getting his care through the [place] regularly.  He has had difficulty with his eyes and is now wearing a patch over the right eye.  He does have a history of glaucoma and apparently there is also some optic nerve damage.  The notes from the [place] from October including October 3 was reviewed.  In that note blood work was done and his hemoglobin A1c at that time was 7.4.  They also do microalbumin.  His medications were reviewed extensively and he is now on Xtandi for his prostate.  He continues on his blood pressure medications, statin, diabetes medications.  All these were reviewed with him and he seems to have a good understanding of them.  His main complaint today is cracking and pain sensation in his knees and now requiring him to use a cane on occasion.  He has not had any gout attacks.  His allergies seem to be under good control.   Immunizations and Health Maintenance Immunization History  Administered Date(s) Administered   Fluad Quad(high Dose 65+) 02/15/2020, 02/27/2021   Influenza Split 02/04/2011, 02/04/2012, 01/30/2014   Influenza Whole 02/18/2009, 02/03/2010   Influenza, High Dose Seasonal PF 02/06/2013, 02/06/2014, 01/17/2015, 02/12/2015, 02/09/2016, 02/13/2016, 02/07/2017, 02/16/2017   Influenza,inj,Quad PF,6+ Mos 02/06/2013   Influenza-Unspecified 03/18/2006, 04/18/2007, 01/17/2008, 02/15/2009, 01/16/2010, 02/04/2011, 02/04/2012, 02/14/2018, 02/14/2018, 02/06/2019, 02/11/2019   Moderna Covid-19 Fall Seasonal Vaccine 32yrs & older 02/09/2023   PFIZER Comirnaty(Gray Top)Covid-19 Tri-Sucrose Vaccine 08/20/2020   PFIZER(Purple Top)SARS-COV-2 Vaccination 06/30/2019, 07/25/2019, 02/16/2020   Pfizer Covid-19 Vaccine Bivalent Booster 63yrs & up 02/27/2021   Pfizer(Comirnaty)Fall Seasonal Vaccine 12 years and older 03/09/2022   Pneumococcal Conjugate-13 08/02/2013, 02/06/2014    Pneumococcal Polysaccharide-23 02/04/2012, 11/29/2020   Pneumococcal-Unspecified 06/20/2010   Tdap 10/16/2009, 02/03/2010, 09/07/2019   Zoster Recombinant(Shingrix) 03/25/2017, 06/02/2017   Zoster, Live 03/25/2006   Health Maintenance Due  Topic Date Due   Diabetic kidney evaluation - Urine ACR  02/26/2021   OPHTHALMOLOGY EXAM  12/18/2021   FOOT EXAM  02/27/2022   HEMOGLOBIN A1C  09/08/2022   Diabetic kidney evaluation - eGFR measurement  03/10/2023    Last colonoscopy: August 2018 Last PSA: April 2023 Dentist: Dentures through [place] Ophtho: Care through [place] Exercise: gardening and walk for a mile around the park   Other doctors [facility] patient include: [facility]  Advanced Directives:  Information given  Depression screen:  See questionnaire below.        03/23/2023    9:24 AM 03/09/2022    9:47 AM 02/27/2021    9:27 AM 02/27/2020    9:39 AM 02/21/2019    9:36 AM  Depression screen PHQ 2/9  Decreased Interest 0 0 0 0 0  Down, Depressed, Hopeless 0 0 0 0 0  PHQ - 2 Score 0 0 0 0 0    Fall Screen: See Questionaire below.      03/23/2023    9:23 AM 03/09/2022    9:46 AM 02/27/2021    9:26 AM 02/27/2020    9:39 AM 02/21/2019    9:36 AM  Fall Risk   Falls in the past year? 0 0 1 1 0  Number falls in past yr: 0 0 0 1 0  Injury with Fall? 0 0 0 0 0  Risk for fall due to :  History of fall(s) Impaired vision    Follow up  Falls evaluation completed Falls evaluation completed  ADL screen:  See questionnaire below.  Functional Status Survey: Is the patient deaf or have difficulty hearing?: No Does the patient have difficulty seeing, even when wearing glasses/contacts?: Yes (can't see out of left eye) Does the patient have difficulty concentrating, remembering, or making decisions?: Yes (age related) Does the patient have difficulty walking or climbing stairs?: Yes Does the patient have difficulty dressing or bathing?: No Does the patient have difficulty doing errands  alone such as visiting a doctor's office or shopping?: No   Review of Systems  Constitutional: -, -unexpected weight change, -anorexia, -fatigue Allergy: -sneezing, -itching, -congestion Dermatology: denies changing moles, rash, lumps ENT: -runny nose, -ear pain, -sore throat,  Cardiology:  -chest pain, -palpitations, -orthopnea, Respiratory: -cough, -shortness of breath, -dyspnea on exertion, -wheezing,  Gastroenterology: -abdominal pain, -nausea, -vomiting, -diarrhea, -constipation, -dysphagia Hematology: -bleeding or bruising problems Musculoskeletal: -arthralgias, -myalgias, -joint swelling, -back pain, - Ophthalmology: -vision changes,  Urology: -dysuria, -difficulty urinating,  -urinary frequency, -urgency, incontinence Neurology: -, -numbness, , -memory loss, -falls, -dizziness Bilateral knee exam shows no effusion.  Full motion without crepitus.  Anterior drawer and McMurray's testing negative   PHYSICAL EXAM:  BP 124/82   Pulse 69   Wt 236 lb 9.6 oz (107.3 kg)   SpO2 95%   BMI 29.97 kg/m   General Appearance: Alert, cooperative, no distress, appears stated age Head: Normocephalic, without obvious abnormality, atraumatic Eyes: PERRL, conjunctiva/corneas clear, EOM's intact,  Ears: Normal TM's and external ear canals Nose: Nares normal, mucosa normal, no drainage or sinus   tenderness Throat: Lips, mucosa, and tongue normal; teeth and gums normal Neck: Supple, no lymphadenopathy, thyroid:no enlargement/tenderness/nodules; no carotid bruit or JVD Lungs: Clear to auscultation bilaterally without wheezes, rales or ronchi; respirations unlabored Heart: Regular rate and rhythm, S1 and S2 normal, no murmur, rub or gallop Skin: Skin color, texture, turgor normal, no rashes or lesions Lymph nodes: Cervical, supraclavicular, and axillary nodes normal Neurologic: CNII-XII intact, normal strength, sensation and gait; reflexes 2+ and symmetric throughout   Psych: Normal mood,  affect, hygiene and grooming The medical record from the [place] was evaluated ASSESSMENT/PLAN: Routine general medical examination at a health care facility  Arthritis  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  Recommend 2 Tylenol 4 times per day as needed for the knee pain and if continued difficulty, [facility]  Medicare Attestation I have personally reviewed: The patient's medical and social history Their use of alcohol, tobacco or illicit drugs Their current medications and supplements The patient's functional ability including ADLs,fall risks, home safety risks, cognitive, and hearing and visual impairment Diet and physical activities Evidence for depression or mood disorders  The patient's weight, height, and BMI have been recorded in the chart.  I have made referrals, counseling, and provided education to the patient based on review of the above and I have provided the patient with a written personalized care plan for preventive services.     Sharlot Gowda, MD   03/23/2023

## 2023-03-23 NOTE — Patient Instructions (Signed)
You can take 2 Tylenol 4 times per day for your aches and pains especially her knees

## 2023-03-24 DIAGNOSIS — C61 Malignant neoplasm of prostate: Secondary | ICD-10-CM | POA: Diagnosis not present

## 2023-06-23 DIAGNOSIS — C61 Malignant neoplasm of prostate: Secondary | ICD-10-CM | POA: Diagnosis not present

## 2023-06-30 DIAGNOSIS — C778 Secondary and unspecified malignant neoplasm of lymph nodes of multiple regions: Secondary | ICD-10-CM | POA: Diagnosis not present

## 2023-06-30 DIAGNOSIS — N393 Stress incontinence (female) (male): Secondary | ICD-10-CM | POA: Diagnosis not present

## 2023-06-30 DIAGNOSIS — C61 Malignant neoplasm of prostate: Secondary | ICD-10-CM | POA: Diagnosis not present

## 2023-07-15 IMAGING — CT NM PET TUM IMG SKULL BASE T - THIGH
7 series · 25 of 25 positions shown · non-contrast
Comparison: Axumin PET scan 08/24/2019

CLINICAL DATA: Prostate carcinoma with biochemical recurrence.

EXAM:
NUCLEAR MEDICINE PET SKULL BASE TO THIGH
TECHNIQUE: 9.7 mCi F18 Piflufolastat (Pylarify) was injected intravenously.
Full-ring PET imaging was performed from the skull base to thigh
after the radiotracer. CT data was obtained and used for attenuation
correction and anatomic localization.

[Series 3: pet sk_thigh ac · axial · 5.0mm · 4.07mm/px · z∈[-478,+522]mm · 6 of 251 slices shown]
[im 1/251]
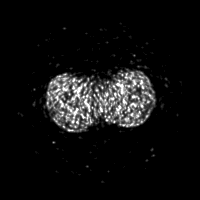
[im 51/251]
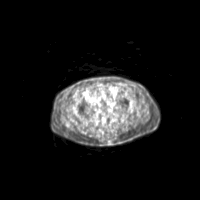
[im 101/251]
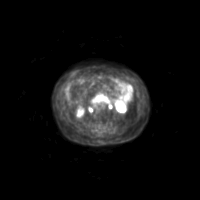
[im 151/251]
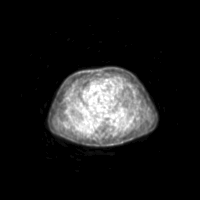
[im 201/251]
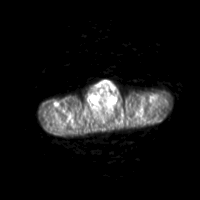
[im 251/251]
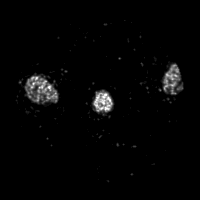

[Series 4: ct sk_thigh 5.0 hd_fov · axial · 5.0mm · 1.33mm/px · z∈[-478,+522]mm · 5 of 251 slices shown]
[im 1/251]
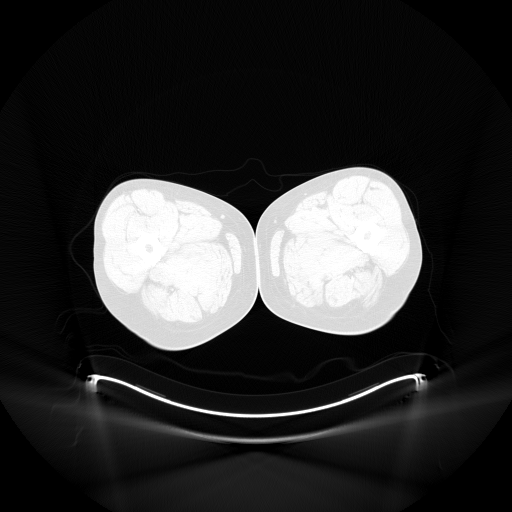
[im 63/251]
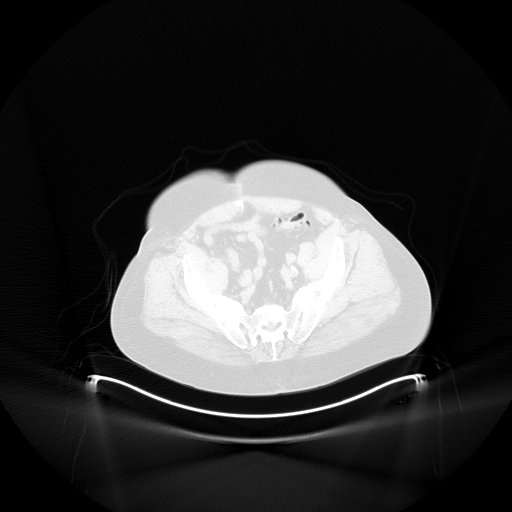
[im 126/251]
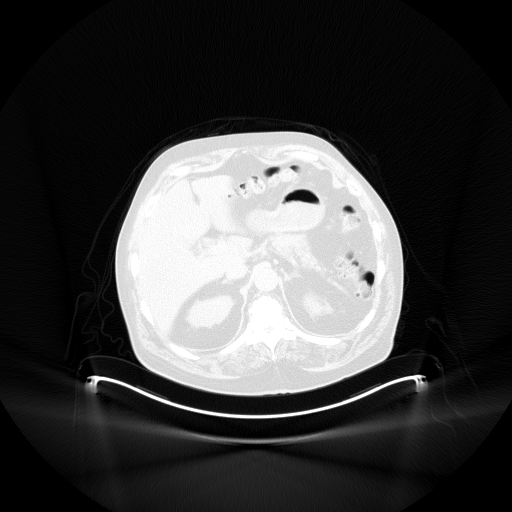
[im 188/251  brain]
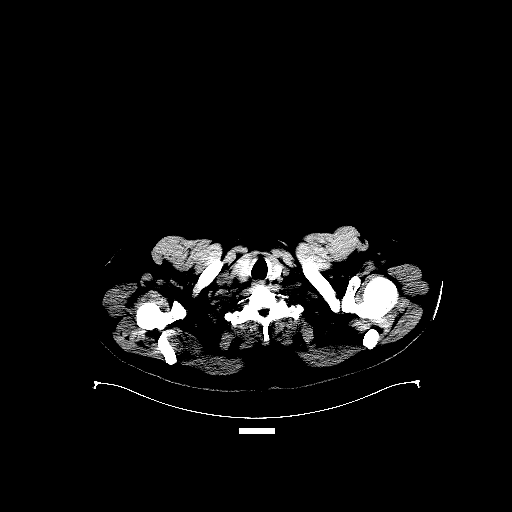
[im 251/251]
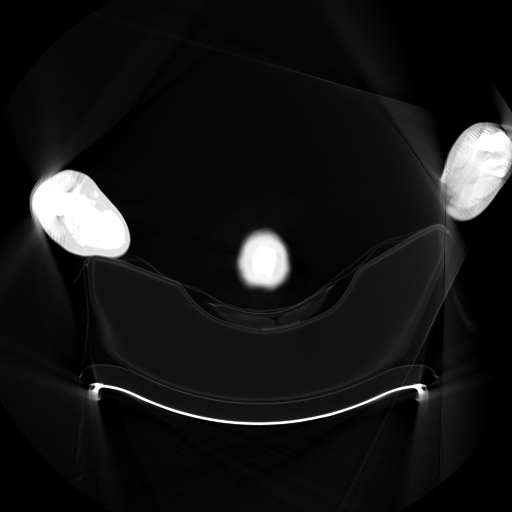

[Series 5: pet sk_thigh nac · axial · 5.0mm · 4.07mm/px · z∈[-478,+522]mm · 5 of 251 slices shown]
[im 1/251]
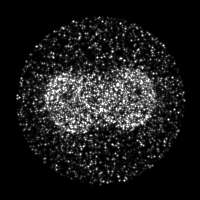
[im 63/251]
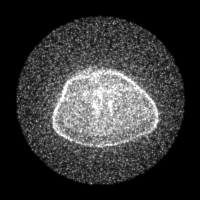
[im 126/251]
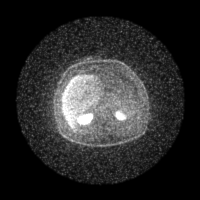
[im 188/251]
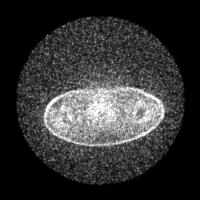
[im 251/251]
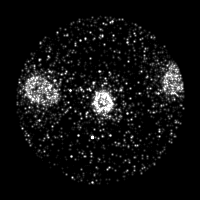

[Series 8: ct sk_thigh 5.0 br59 (id)_bone · axial · 5.0mm · 0.74mm/px · z∈[-22,+274]mm · 2 of 75 slices shown]
[im 1/75]
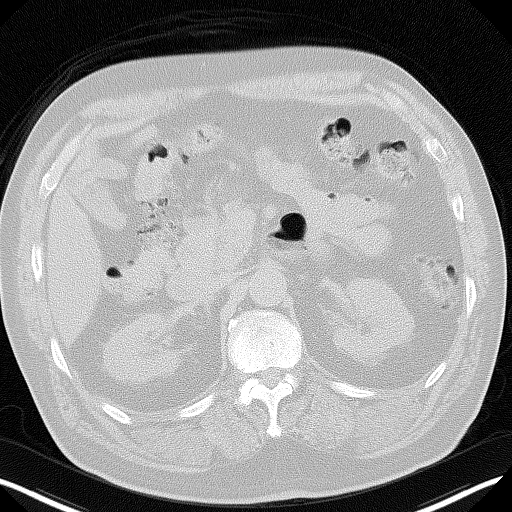
[im 75/75  brain]
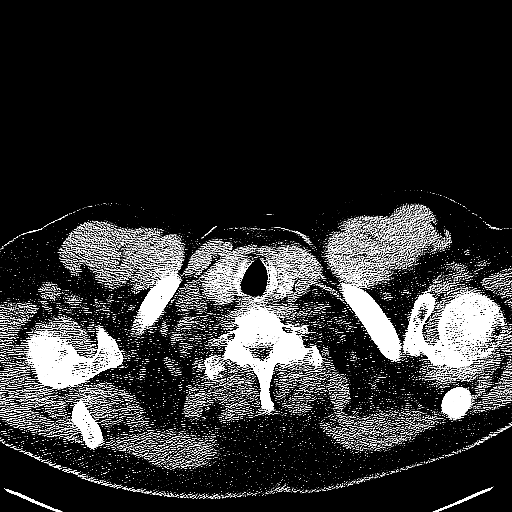

[Series 603: fused cor · 1 of 67 slices shown]
[im 1/67]
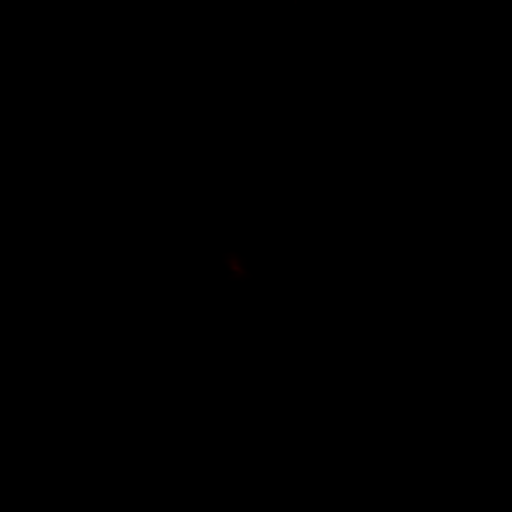

[Series 604: <mip collection> · coronal · 2.07mm/px · 1 of 32 slices shown]
[im 1/32]
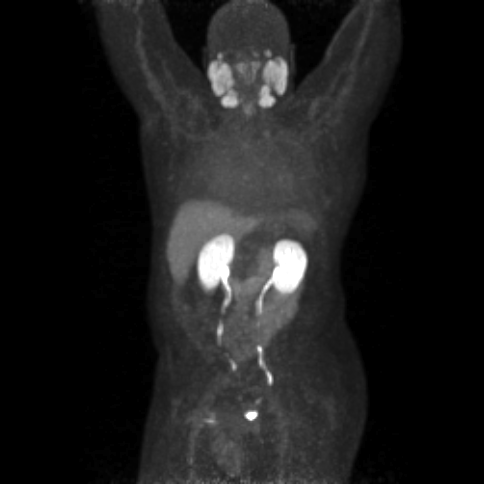

[Series 605: range-ct sk_thigh 5.0 hd_fov-tra-<alpha range> · 5 of 231 slices shown]
[im 1/231]
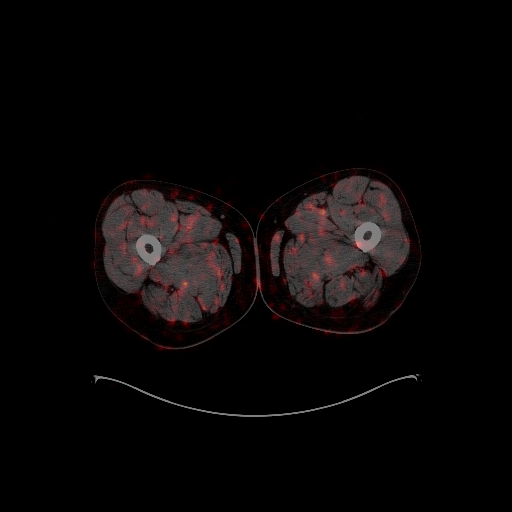
[im 58/231]
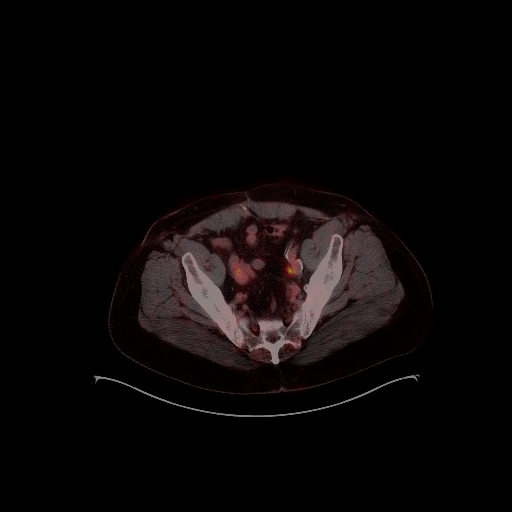
[im 116/231]
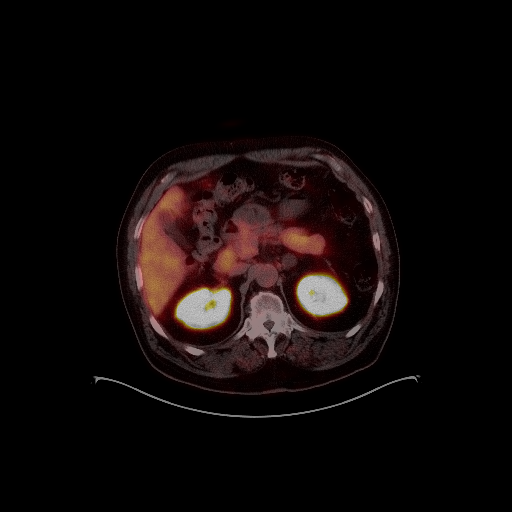
[im 173/231]
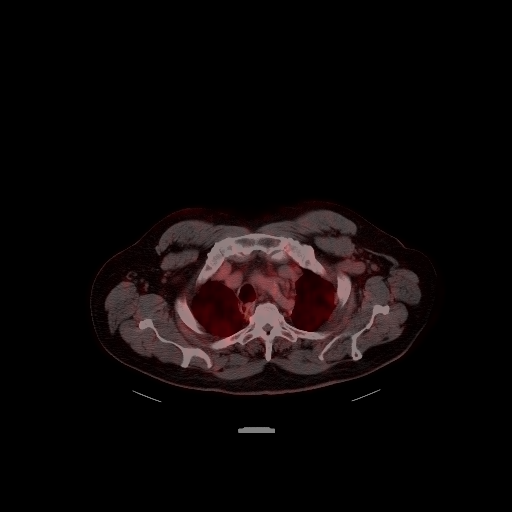
[im 231/231]
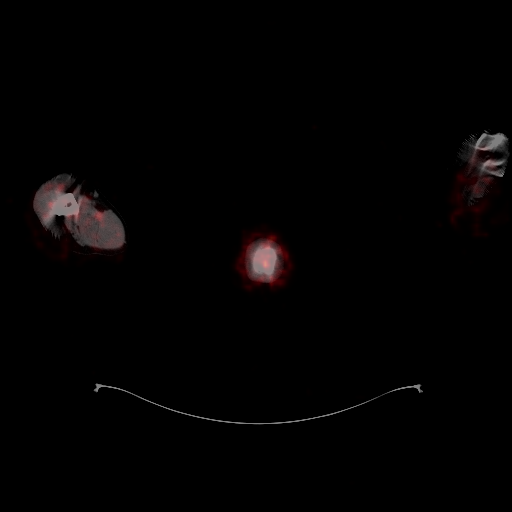

[25 of 25 positions shown; findings below may reference images not displayed]

FINDINGS: NECK

No radiotracer activity in neck lymph nodes.

Incidental CT finding: None

CHEST

No radiotracer accumulation within mediastinal or hilar lymph nodes.
No suspicious pulmonary nodules on the CT scan.

Incidental CT finding: Interstitial thickening in the lungs
unchanged from prior. Centrilobular emphysema the upper lobes. Mild
ground-glass opacities are new from prior.

ABDOMEN/PELVIS

Prostate: No focal activity in prostatectomy bed.

Lymph nodes: Interval resolution radiotracer activity associated
with the LEFT iliac lymph nodes and LEFT periaortic retroperitoneal
nodes. No residual activity at these nodal stations.

Additionally, the nodes have regressed in size. For example lymph
node LEFT aorta at the level the bifurcation measuring 13 mm on
comparison exam is no longer measurable. LEFT common iliac node
measuring 11 mm on prior is no longer identifiable.

Liver: No evidence of liver metastasis

Incidental CT finding: None

SKELETON

No focal  activity to suggest skeletal metastasis.
IMPRESSION: 1. Resolution radiotracer activity associated with the LEFT iliac
lymph nodes and LEFT periaortic retroperitoneal lymph nodes.
2. No evidence of PSMA positive prostate cancer metastasis within
the prostate bed, lymph nodes, visceral organs or skeleton.
3. Increase in ground-glass opacities in lungs  and emphysema.

## 2023-09-15 DIAGNOSIS — C61 Malignant neoplasm of prostate: Secondary | ICD-10-CM | POA: Diagnosis not present

## 2023-09-22 DIAGNOSIS — C61 Malignant neoplasm of prostate: Secondary | ICD-10-CM | POA: Diagnosis not present

## 2023-12-22 DIAGNOSIS — C61 Malignant neoplasm of prostate: Secondary | ICD-10-CM | POA: Diagnosis not present

## 2023-12-22 DIAGNOSIS — N393 Stress incontinence (female) (male): Secondary | ICD-10-CM | POA: Diagnosis not present

## 2024-02-02 ENCOUNTER — Other Ambulatory Visit (INDEPENDENT_AMBULATORY_CARE_PROVIDER_SITE_OTHER)

## 2024-02-02 DIAGNOSIS — Z23 Encounter for immunization: Secondary | ICD-10-CM

## 2024-03-14 DIAGNOSIS — Z7982 Long term (current) use of aspirin: Secondary | ICD-10-CM | POA: Diagnosis not present

## 2024-03-14 DIAGNOSIS — H5461 Unqualified visual loss, right eye, normal vision left eye: Secondary | ICD-10-CM | POA: Diagnosis not present

## 2024-03-14 DIAGNOSIS — Z6831 Body mass index (BMI) 31.0-31.9, adult: Secondary | ICD-10-CM | POA: Diagnosis not present

## 2024-03-14 DIAGNOSIS — E1140 Type 2 diabetes mellitus with diabetic neuropathy, unspecified: Secondary | ICD-10-CM | POA: Diagnosis not present

## 2024-03-14 DIAGNOSIS — Z833 Family history of diabetes mellitus: Secondary | ICD-10-CM | POA: Diagnosis not present

## 2024-03-14 DIAGNOSIS — I10 Essential (primary) hypertension: Secondary | ICD-10-CM | POA: Diagnosis not present

## 2024-03-14 DIAGNOSIS — Z8249 Family history of ischemic heart disease and other diseases of the circulatory system: Secondary | ICD-10-CM | POA: Diagnosis not present

## 2024-03-14 DIAGNOSIS — Z9989 Dependence on other enabling machines and devices: Secondary | ICD-10-CM | POA: Diagnosis not present

## 2024-03-14 DIAGNOSIS — H409 Unspecified glaucoma: Secondary | ICD-10-CM | POA: Diagnosis not present

## 2024-03-14 DIAGNOSIS — E785 Hyperlipidemia, unspecified: Secondary | ICD-10-CM | POA: Diagnosis not present

## 2024-03-14 DIAGNOSIS — Z87891 Personal history of nicotine dependence: Secondary | ICD-10-CM | POA: Diagnosis not present

## 2024-03-14 DIAGNOSIS — N393 Stress incontinence (female) (male): Secondary | ICD-10-CM | POA: Diagnosis not present

## 2024-03-14 DIAGNOSIS — J309 Allergic rhinitis, unspecified: Secondary | ICD-10-CM | POA: Diagnosis not present

## 2024-03-14 DIAGNOSIS — E669 Obesity, unspecified: Secondary | ICD-10-CM | POA: Diagnosis not present

## 2024-03-14 DIAGNOSIS — Z8589 Personal history of malignant neoplasm of other organs and systems: Secondary | ICD-10-CM | POA: Diagnosis not present

## 2024-03-14 DIAGNOSIS — Z8546 Personal history of malignant neoplasm of prostate: Secondary | ICD-10-CM | POA: Diagnosis not present

## 2024-03-28 DIAGNOSIS — N393 Stress incontinence (female) (male): Secondary | ICD-10-CM | POA: Diagnosis not present

## 2024-03-28 DIAGNOSIS — C61 Malignant neoplasm of prostate: Secondary | ICD-10-CM | POA: Diagnosis not present

## 2024-03-28 DIAGNOSIS — R399 Unspecified symptoms and signs involving the genitourinary system: Secondary | ICD-10-CM | POA: Diagnosis not present

## 2024-05-09 ENCOUNTER — Ambulatory Visit: Payer: Medicare HMO | Admitting: Family Medicine

## 2024-05-09 ENCOUNTER — Encounter: Payer: Self-pay | Admitting: Family Medicine

## 2024-05-09 VITALS — BP 128/86 | HR 76 | Ht 76.0 in | Wt 256.6 lb

## 2024-05-09 DIAGNOSIS — E119 Type 2 diabetes mellitus without complications: Secondary | ICD-10-CM | POA: Diagnosis not present

## 2024-05-09 DIAGNOSIS — Z125 Encounter for screening for malignant neoplasm of prostate: Secondary | ICD-10-CM

## 2024-05-09 DIAGNOSIS — Z7984 Long term (current) use of oral hypoglycemic drugs: Secondary | ICD-10-CM

## 2024-05-09 DIAGNOSIS — Z0000 Encounter for general adult medical examination without abnormal findings: Secondary | ICD-10-CM

## 2024-05-09 LAB — POCT UA - MICROALBUMIN
Albumin/Creatinine Ratio, Urine, POC: 5
Creatinine, POC: 90.6 mg/dL
Microalbumin Ur, POC: 5.5 mg/L

## 2024-05-09 LAB — POCT GLYCOSYLATED HEMOGLOBIN (HGB A1C): Hemoglobin A1C: 7.7 % — AB (ref 4.0–5.6)

## 2024-05-09 LAB — COMPREHENSIVE METABOLIC PANEL WITH GFR
ALT: 15 IU/L (ref 0–44)
AST: 18 IU/L (ref 0–40)
Albumin: 4 g/dL (ref 3.8–4.8)
Alkaline Phosphatase: 89 IU/L (ref 47–123)
BUN/Creatinine Ratio: 11 (ref 10–24)
BUN: 14 mg/dL (ref 8–27)
Bilirubin Total: 0.5 mg/dL (ref 0.0–1.2)
CO2: 24 mmol/L (ref 20–29)
Calcium: 9 mg/dL (ref 8.6–10.2)
Chloride: 103 mmol/L (ref 96–106)
Creatinine, Ser: 1.27 mg/dL (ref 0.76–1.27)
Globulin, Total: 2.8 g/dL (ref 1.5–4.5)
Glucose: 151 mg/dL — ABNORMAL HIGH (ref 70–99)
Potassium: 4.5 mmol/L (ref 3.5–5.2)
Sodium: 140 mmol/L (ref 134–144)
Total Protein: 6.8 g/dL (ref 6.0–8.5)
eGFR: 57 mL/min/1.73 — ABNORMAL LOW

## 2024-05-09 LAB — CBC WITH DIFFERENTIAL/PLATELET
Basophils Absolute: 0 x10E3/uL (ref 0.0–0.2)
Basos: 1 %
EOS (ABSOLUTE): 0.2 x10E3/uL (ref 0.0–0.4)
Eos: 4 %
Hematocrit: 43.5 % (ref 37.5–51.0)
Hemoglobin: 13.3 g/dL (ref 13.0–17.7)
Immature Grans (Abs): 0 x10E3/uL (ref 0.0–0.1)
Immature Granulocytes: 0 %
Lymphocytes Absolute: 1.1 x10E3/uL (ref 0.7–3.1)
Lymphs: 21 %
MCH: 26.7 pg (ref 26.6–33.0)
MCHC: 30.6 g/dL — ABNORMAL LOW (ref 31.5–35.7)
MCV: 87 fL (ref 79–97)
Monocytes Absolute: 0.6 x10E3/uL (ref 0.1–0.9)
Monocytes: 12 %
Neutrophils Absolute: 3.1 x10E3/uL (ref 1.4–7.0)
Neutrophils: 62 %
Platelets: 260 x10E3/uL (ref 150–450)
RBC: 4.98 x10E6/uL (ref 4.14–5.80)
RDW: 15 % (ref 11.6–15.4)
WBC: 5.1 x10E3/uL (ref 3.4–10.8)

## 2024-05-09 LAB — PSA: Prostate Specific Ag, Serum: <0.1 ng/mL (ref 0.0–4.0)

## 2024-05-09 LAB — LIPID PANEL
Chol/HDL Ratio: 2.6 ratio (ref 0.0–5.0)
Cholesterol, Total: 129 mg/dL (ref 100–199)
HDL: 50 mg/dL
LDL Chol Calc (NIH): 65 mg/dL (ref 0–99)
Triglycerides: 67 mg/dL (ref 0–149)
VLDL Cholesterol Cal: 14 mg/dL (ref 5–40)

## 2024-05-09 NOTE — Progress Notes (Signed)
 "  Chief Complaint  Patient presents with   Annual Exam    Cpe/awv.      Subjective:   [name] is a 78 y.o. male who presents for a Medicare Annual Wellness Visit.  Visit info / Clinical Intake: Medicare Wellness Visit Type:: Subsequent Annual Wellness Visit Persons participating in visit and providing information:: patient Medicare Wellness Visit Mode:: In-person (required for WTM) Interpreter Needed?: No Pre-visit prep was completed: no AWV questionnaire completed by patient prior to visit?: no Living arrangements:: lives with spouse/significant other Patient's Overall Health Status Rating: good Typical amount of pain: none Does pain affect daily life?: no Are you currently prescribed opioids?: no  Dietary Habits and Nutritional Risks How many meals a day?: (!) 1 Eats fruit and vegetables daily?: yes Most meals are obtained by: preparing own meals In the last 2 weeks, have you had any of the following?: none Diabetic:: (!) yes Any non-healing wounds?: no How often do you check your BS?: 1 Would you like to be referred to a Nutritionist or for Diabetic Management? : no  Functional Status Activities of Daily Living (to include ambulation/medication): Independent Ambulation: Independent Medication Administration: Independent Home Management (perform basic housework or laundry): Independent Manage your own finances?: yes Primary transportation is: driving Concerns about vision?: (!) yes (seeing eye doctor) Concerns about hearing?: no  Fall Screening Falls in the past year?: 1 Number of falls in past year: 0 Was there an injury with Fall?: 0 Fall Risk Category Calculator: 1 Patient Fall Risk Level: Low Fall Risk  Fall Risk Patient at Risk for Falls Due to: History of fall(s) Fall risk Follow up: Falls evaluation completed  Home and Transportation Safety: All rugs have non-skid backing?: yes All stairs or steps have railings?: N/A, no stairs Grab bars in  the bathtub or shower?: yes Have non-skid surface in bathtub or shower?: yes Good home lighting?: yes Regular seat belt use?: yes Hospital stays in the last year:: no  Cognitive Assessment Difficulty concentrating, remembering, or making decisions? : no Will 6CIT or Mini Cog be Completed: yes What year is it?: 0 points What month is it?: 0 points Give patient an address phrase to remember (5 components): [address] About what time is it?: 0 points Count backwards from 20 to 1: 0 points Say the months of the year in reverse: 0 points Repeat the address phrase from earlier: 0 points 6 CIT Score: 0 points  Advance Directives (For Healthcare) Does Patient Have a Medical Advance Directive?: Yes Does patient want to make changes to medical advance directive?: No - Patient declined Type of Advance Directive: Healthcare Power of [place]; Living will; Out of facility DNR (pink MOST or yellow form) Copy of Healthcare Power of Attorney in Chart?: Yes - validated most recent copy scanned in chart (See row information) Copy of Living Will in Chart?: Yes - validated most recent copy scanned in chart (See row information) Out of facility DNR (pink MOST or yellow form) in Chart? (Ambulatory ONLY): Yes - validated most recent copy scanned in chart  Reviewed/Updated  Reviewed/Updated: Reviewed All (Medical, Surgical, Family, Medications, Allergies, Care Teams, Patient Goals)    Allergies (verified) Ace inhibitors and Lisinopril    Current Medications (verified) Outpatient Encounter Medications as of 05/09/2024  Medication Sig   acetaminophen (TYLENOL) 325 MG tablet TAKE TWO TABLETS BY MOUTH FOUR TIMES A DAY AS NEEDED FOR PAIN   aspirin 81 MG tablet Take 81 mg by mouth daily.   benzonatate (TESSALON) 100  MG capsule Take by mouth.   Brinzolamide-Brimonidine (SIMBRINZA) 1-0.2 % SUSP Place 1 drop into both eyes 3 times daily.   Calcium Carbonate-Vitamin D 600-125 MG-UNIT TABS Take 1 tablet by  mouth daily.     carboxymethylcellulose (REFRESH PLUS) 0.5 % SOLN INSTILL 1 DROP IN BOTH EYES FOUR TIMES A DAY --  TO REPLACE CARBOXYMETHYLCELLULOSE 1% OPH GEL 0.4ML (PRESERVATIVE FREE)  WHILE IT IS ON LONG TERM MANUFACTURER BACKORDER AND IS CURRENTLY  UNAVAILABLE --  TO REPLACE CARBOXYMETHYLCELLULOSE 1% OPH GEL 0.4ML (PRESERVATIVE FREE)   WHILE IT IS ON LONG TERM MANUFACTURER BACKORDER AND IS CURRENTLY   UNAVAILABLE   empagliflozin (JARDIANCE) 25 MG TABS tablet Take 12.5 mg by mouth daily.   fluticasone  (FLONASE ) 50 MCG/ACT nasal spray Place 2 sprays into both nostrils daily as needed.   gabapentin (NEURONTIN) 600 MG tablet Take 1 tablet by mouth 2 (two) times daily.   glipiZIDE (GLUCOTROL) 5 MG tablet Take 7.5 mg by mouth 2 (two) times daily before a meal.   Glycerin-Polysorbate 80 (REFRESH DRY EYE THERAPY OP) Place 1 drop into both eyes as needed (DRY EYES).   ketorolac (ACULAR) 0.5 % ophthalmic solution Place 1 drop into the right eye 4 times daily.   latanoprost (XALATAN) 0.005 % ophthalmic solution Place 1 drop into the right eye nightly.   Latanoprostene Bunod 0.024 % SOLN INSTILL 1 DROP IN BOTH EYES AT BEDTIME   loratadine (CLARITIN) 10 MG tablet Take 10 mg by mouth daily as needed for allergies.   losartan (COZAAR) 100 MG tablet Take 100 mg by mouth daily.   Melatonin 10 MG CAPS TAKE 1 TABLET/CAPSULE (3MG ) BY MOUTH AT BEDTIME SLEEP   melatonin 3 MG TABS tablet Take 3 mg by mouth at bedtime.   metFORMIN (GLUCOPHAGE) 1000 MG tablet Take 1,000 mg by mouth 2 (two) times daily with a meal.   Multiple Vitamin (MULTIVITAMIN ADULT PO) Take by mouth.   Netarsudil Dimesylate 0.02 % SOLN INSTILL ONE DROP IN BOTH EYES DAILY   pravastatin  (PRAVACHOL ) 40 MG tablet Take 40 mg by mouth daily.   PSYLLIUM PO TAKE 1 TEASPOONFUL BY MOUTH EVERY DAY   timolol (TIMOPTIC) 0.5 % ophthalmic solution Place 1 drop into both eyes 2 times daily.   [DISCONTINUED] benazepril-hydrochlorthiazide (LOTENSIN HCT) 10-12.5 MG  tablet HCTZ   cyclobenzaprine (FLEXERIL) 10 MG tablet TAKE ONE-HALF TABLET BY MOUTH THREE TIMES A DAY AS NEEDED BACK PAIN (MAY CAUSE DROWSINESS) (Patient not taking: Reported on 05/09/2024)   ketorolac (ACULAR) 0.5 % ophthalmic solution Place 1 drop into the right eye 4 times daily. (Patient not taking: Reported on 05/09/2024)   lidocaine (LIDODERM) 5 % APPLY 1 PATCH TO SKIN ONCE DAILY (APPLY FOR 12 HOURS, THEN REMOVE FOR 12 HOURS) (Patient not taking: Reported on 05/09/2024)   METFORMIN & DIET MANAGE PROD PO Metformin (Patient not taking: Reported on 05/09/2024)   [DISCONTINUED] Aspirin 81 MG CAPS Aspirin 81 mg (Patient not taking: Reported on 05/09/2024)   [DISCONTINUED] azaTHIOprine (IMURAN) 50 MG tablet Take 100 mg by mouth in the morning and at bedtime. (Patient not taking: Reported on 05/09/2024)   [DISCONTINUED] pravastatin  (PRAVACHOL ) 20 MG tablet Pravachol  (Patient not taking: Reported on 05/09/2024)   [DISCONTINUED] sucralfate  (CARAFATE ) 1 g tablet Take 1 tablet (1 g total) by mouth 4 (four) times daily -  with meals and at bedtime. 5 min before meals for radiation induced esophagitis (Patient not taking: Reported on 05/09/2024)   [DISCONTINUED] timolol (TIMOPTIC) 0.5 % ophthalmic solution Place 1  drop into both eyes 2 times daily. (Patient not taking: Reported on 05/09/2024)   No facility-administered encounter medications on file as of 05/09/2024.    History: Past Medical History:  Diagnosis Date   Arthritis    Diabetes mellitus    Diverticulosis    Dyslipidemia    ED (erectile dysfunction)    Hemorrhoids    Hypertension    Prostate cancer [facility])    Past Surgical History:  Procedure Laterality Date   COLONOSCOPY  2008   Dr.hung   PROSTATECTOMY     TRABECULECTOMY Right    tubla adenoma N/A 12/2016   Family History  Problem Relation Age of Onset   Diabetes Mother    Diabetes Sister    Diabetes Brother    Diabetes Maternal Aunt    Diabetes Maternal Uncle    Diabetes  Maternal Grandmother    Diabetes Maternal Grandfather    Breast cancer Neg Hx    Colon cancer Neg Hx    Prostate cancer Neg Hx    Pancreatic cancer Neg Hx    Social History   Occupational History   Not on file  Tobacco Use   Smoking status: Former   Smokeless tobacco: Never  Vaping Use   Vaping status: Never Used  Substance and Sexual Activity   Alcohol use: No   Drug use: No   Sexual activity: Not Currently   Tobacco Counseling Counseling given: Not Answered  SDOH Screenings   Food Insecurity: No Food Insecurity (05/09/2024)  Housing: Unknown (05/09/2024)  Transportation Needs: No Transportation Needs (05/09/2024)  Utilities: Not At Risk (05/09/2024)  Depression (PHQ2-9): Low Risk (05/09/2024)  Physical Activity: Sufficiently Active (05/09/2024)  Social Connections: Socially Isolated (05/09/2024)  Stress: No Stress Concern Present (05/09/2024)  Tobacco Use: Medium Risk (05/09/2024)  Health Literacy: Adequate Health Literacy (05/09/2024)   See flowsheets for full screening details  Depression Screen PHQ 2 & 9 Depression Scale- Over the past 2 weeks, how often have you been bothered by any of the following problems? Little interest or pleasure in doing things: 0 Feeling down, depressed, or hopeless (PHQ Adolescent also includes...irritable): 0 PHQ-2 Total Score: 0     Goals Addressed             This Visit's Progress    Patient Stated       Maintain current health and fitness             Objective:    Today's Vitals   05/09/24 0923  BP: 128/86  Pulse: 76  SpO2: 98%  Weight: 256 lb 9.6 oz (116.4 kg)  Height: 6' 4 (1.93 m)   Body mass index is 31.23 kg/m.  Hearing/Vision screen No results found. Immunizations and Health Maintenance Health Maintenance  Topic Date Due   Diabetic kidney evaluation - Urine ACR  02/26/2021   HEMOGLOBIN A1C  09/08/2022   Diabetic kidney evaluation - eGFR measurement  03/10/2023   FOOT EXAM  05/10/2024  (Originally 02/27/2022)   COVID-19 Vaccine (9 - Pfizer risk 2025-26 season) 08/24/2024   OPHTHALMOLOGY EXAM  12/26/2024   Medicare Annual Wellness (AWV)  05/09/2025   DTaP/Tdap/Td (4 - Td or Tdap) 09/06/2029   Pneumococcal Vaccine: 50+ Years  Completed   Influenza Vaccine  Completed   Hepatitis C Screening  Completed   Zoster Vaccines- Shingrix  Completed   Meningococcal B Vaccine  Aged Out   Colonoscopy  Discontinued        Assessment/Plan:  This is a routine wellness examination for  [name].  Patient Care Team: Vita Morrow, MD as PCP - General (Family Medicine) Liane Sharyne MATSU, [facility] (Inactive) as Pharmacist (Pharmacist)  I have personally reviewed and noted the following in the patients chart:   Medical and social history Use of alcohol, tobacco or illicit drugs  Current medications and supplements including opioid prescriptions. Functional ability and status Nutritional status Physical activity Advanced directives List of other physicians Hospitalizations, surgeries, and ER visits in previous 12 months Vitals Screenings to include cognitive, depression, and falls Referrals and appointments >=15 minutes spent in face-to-face counseling focused on reducing cardiovascular risk.  Topics reviewed: - Assessment of individual CVD risk factors (BP, lipids, glucose, weight, activity level) - Lifestyle modifications including heart-healthy diet, sodium reduction, and increased physical activity - Smoking cessation and alcohol moderation when applicable - Importance of blood pressure control, lipid optimization, and medication adherence - Personalized risk-reduction plan discussed and agreed upon  Orders Placed This Encounter  Procedures   Comprehensive metabolic panel   CBC with Differential   Lipid Panel   PSA   HgB A1c   POCT Urine Albumin/Creatinine with ratio [[id number]]   In addition, I have reviewed and discussed with patient certain preventive protocols, quality  metrics, and best practice recommendations. A written personalized care plan for preventive services as well as general preventive health recommendations were provided to patient.   Morrow DELENA Vita, MD   05/09/2024   No follow-ups on file.  After Visit Summary: (In Person-Printed) AVS printed and given to the patient  Nurse Notes: none "

## 2024-05-09 NOTE — Progress Notes (Signed)
 "  Name: [name]   Date of Visit: 05/09/2024   Date of last visit with me: Visit date not found   CHIEF COMPLAINT:  Chief Complaint  Patient presents with   Annual Exam    Cpe/awv.        HPI:  Discussed the use of AI scribe software for clinical note transcription with the patient, who gave verbal consent to proceed.  History of Present Illness   [name] is a 78 year old male with diabetes and hypertension who presents for routine follow-up.  He reports a recent A1c of 7.7. He is currently taking glipizide, metformin, and a half tablet of Jardiance for diabetes management. He has been trying to manage his weight by eating one meal a day, having previously lost weight from 273 pounds to 230 pounds last year, but has since regained some weight.  He is on medication for hypertension, taking losartan and is uncertain about taking benazepril hydrochlorothiazide . He confirms taking one blood pressure pill daily.  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 He is under the care of the [facility] for his eye condition.  He walks a mile a day for exercise. He has experienced a recent death in the family, with his last living brother passing away at age 39 after suffering from dementia.         OBJECTIVE:       05/09/2024    9:19 AM  Depression screen PHQ 2/9  Decreased Interest 0  Down, Depressed, Hopeless 0  PHQ - 2 Score 0     BP Readings from Last 3 Encounters:  05/09/24 128/86  03/23/23 124/82  10/21/21 (!) 122/92    BP 128/86   Pulse 76   Ht 6' 4 (1.93 m)   Wt 256 lb 9.6 oz (116.4 kg)   SpO2 98%   BMI 31.23 kg/m    Physical Exam          Physical Exam Constitutional:      Appearance: Normal appearance.  Neurological:     General: No focal deficit present.     Mental Status: He is alert and oriented to person, place, and time. Mental status is at baseline.      ASSESSMENT/PLAN:   Assessment & Plan Encounter for Medicare annual wellness exam  Annual physical exam  Diabetes mellitus treated with oral medication (HCC)  Encounter for screening prostate specific antigen (PSA) measurement    Assessment and Plan    Type 2 diabetes mellitus A1c at 7.7, slightly elevated but acceptable for age. Weight gain may have contributed. - Continue glipizide, metformin, Jardiance. - Encouraged weight loss to improve A1c.  Essential hypertension Blood pressure well-controlled with current regimen. - Continue losartan. - Had discussion regarding benazipril/hydrochlorothiazide . Patient unsure if he is taking, will hav epatinet follow up in 4 months and bring medicines with him.  General Health Maintenance Engages in regular physical activity and attempts weight loss. - Encouraged continued physical activity and weight management.  -Comprehensive annual physical exam completed today. Reviewed interval history, current medical issues, medications, allergies, and preventive care needs. Addressed all patient questions and concerns. Discussed lifestyle factors including diet, exercise, sleep, and stress management. Reviewed recommended age-appropriate screenings, labs, and vaccinations. Counseling provided on healthy habits and routine health maintenance. Follow-up as indicated based on findings and results.         Phillip Sandler A. Vita MD [facility] "

## 2024-05-09 NOTE — Patient Instructions (Addendum)
 Please bring your medications with you at your follow up appt

## 2024-05-10 ENCOUNTER — Ambulatory Visit: Payer: Self-pay | Admitting: Family Medicine

## 2024-09-07 ENCOUNTER — Ambulatory Visit: Admitting: Family Medicine
